# Patient Record
Sex: Female | Born: 1945 | Race: Black or African American | Hispanic: No | Marital: Single | Smoking: Never smoker
Health system: Southern US, Community
[De-identification: ages and names within clinical notes are randomized; demographics above are authoritative.]

## PROBLEM LIST (undated history)

## (undated) DIAGNOSIS — F419 Anxiety disorder, unspecified: Secondary | ICD-10-CM

## (undated) DIAGNOSIS — K5790 Diverticulosis of intestine, part unspecified, without perforation or abscess without bleeding: Secondary | ICD-10-CM

## (undated) DIAGNOSIS — K219 Gastro-esophageal reflux disease without esophagitis: Secondary | ICD-10-CM

## (undated) DIAGNOSIS — I10 Essential (primary) hypertension: Secondary | ICD-10-CM

## (undated) DIAGNOSIS — K2210 Ulcer of esophagus without bleeding: Secondary | ICD-10-CM

## (undated) HISTORY — PX: ABDOMINAL SURGERY: SHX537

## (undated) HISTORY — PX: OTHER SURGICAL HISTORY: SHX169

## (undated) HISTORY — DX: Gastro-esophageal reflux disease without esophagitis: K21.9

## (undated) HISTORY — DX: Ulcer of esophagus without bleeding: K22.10

## (undated) HISTORY — PX: ABDOMINAL HYSTERECTOMY: SHX81

## (undated) HISTORY — PX: APPENDECTOMY: SHX54

---

## 2003-12-07 ENCOUNTER — Emergency Department (HOSPITAL_COMMUNITY): Admission: EM | Admit: 2003-12-07 | Discharge: 2003-12-07 | Payer: Self-pay | Admitting: Emergency Medicine

## 2004-03-08 ENCOUNTER — Encounter: Payer: Self-pay | Admitting: Orthopaedic Surgery

## 2004-04-06 ENCOUNTER — Encounter: Payer: Self-pay | Admitting: Orthopaedic Surgery

## 2006-02-19 ENCOUNTER — Inpatient Hospital Stay (HOSPITAL_COMMUNITY): Admission: EM | Admit: 2006-02-19 | Discharge: 2006-02-23 | Payer: Self-pay | Admitting: Emergency Medicine

## 2006-02-19 ENCOUNTER — Ambulatory Visit: Payer: Self-pay | Admitting: Internal Medicine

## 2006-05-04 ENCOUNTER — Ambulatory Visit: Payer: Self-pay | Admitting: Internal Medicine

## 2006-05-16 ENCOUNTER — Ambulatory Visit (HOSPITAL_COMMUNITY): Admission: RE | Admit: 2006-05-16 | Discharge: 2006-05-16 | Payer: Self-pay | Admitting: Internal Medicine

## 2006-05-16 ENCOUNTER — Ambulatory Visit: Payer: Self-pay | Admitting: Internal Medicine

## 2006-07-06 ENCOUNTER — Ambulatory Visit (HOSPITAL_COMMUNITY): Admission: RE | Admit: 2006-07-06 | Discharge: 2006-07-06 | Payer: Self-pay | Admitting: Internal Medicine

## 2007-03-04 ENCOUNTER — Emergency Department (HOSPITAL_COMMUNITY): Admission: EM | Admit: 2007-03-04 | Discharge: 2007-03-04 | Payer: Self-pay | Admitting: Emergency Medicine

## 2007-03-26 ENCOUNTER — Emergency Department (HOSPITAL_COMMUNITY): Admission: EM | Admit: 2007-03-26 | Discharge: 2007-03-26 | Payer: Self-pay | Admitting: Emergency Medicine

## 2007-04-05 ENCOUNTER — Emergency Department (HOSPITAL_COMMUNITY): Admission: EM | Admit: 2007-04-05 | Discharge: 2007-04-05 | Payer: Self-pay | Admitting: Emergency Medicine

## 2007-10-18 ENCOUNTER — Emergency Department (HOSPITAL_COMMUNITY): Admission: EM | Admit: 2007-10-18 | Discharge: 2007-10-18 | Payer: Self-pay | Admitting: Emergency Medicine

## 2007-10-31 ENCOUNTER — Emergency Department (HOSPITAL_COMMUNITY): Admission: EM | Admit: 2007-10-31 | Discharge: 2007-10-31 | Payer: Self-pay | Admitting: Emergency Medicine

## 2009-01-25 ENCOUNTER — Emergency Department (HOSPITAL_COMMUNITY): Admission: EM | Admit: 2009-01-25 | Discharge: 2009-01-25 | Payer: Self-pay | Admitting: Emergency Medicine

## 2009-05-30 IMAGING — CT CT HEAD W/O CM
1 series · 16 of 30 positions shown, 20 images · IV contrast (agent unspecified)
Comparison: none

CLINICAL DATA: Dizziness.  No known injury.  
 HEAD CT WITHOUT CONTRAST:
TECHNIQUE: Contiguous axial images were obtained from the base of the skull through the vertex according to standard protocol without contrast.

[Series 2: headseq 4.8 h37s · axial · 0.45mm/px · z∈[+115,+248]mm · 16 of 30 slices shown, 20 images]
[im 2/30  brain]
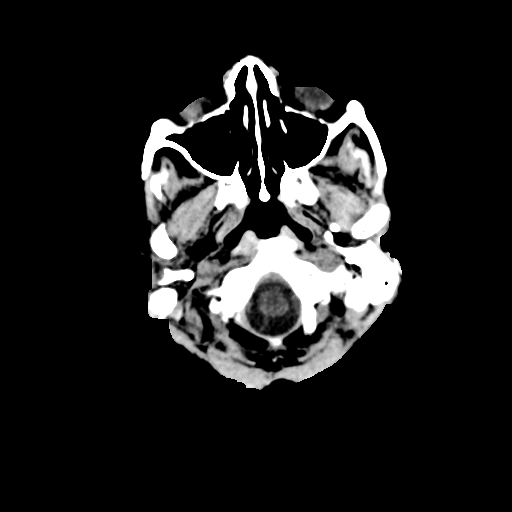
[im 2/30  bone]
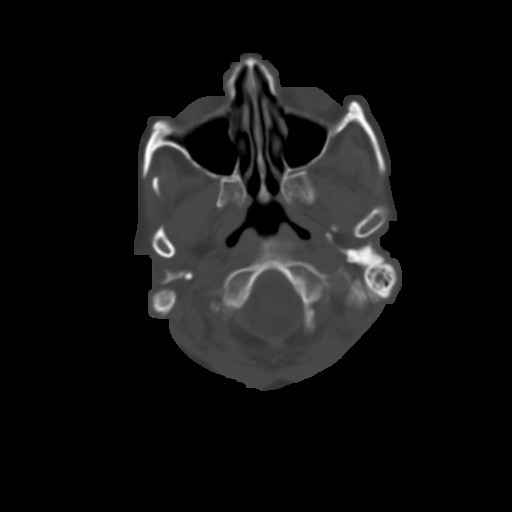
[im 4/30  brain]
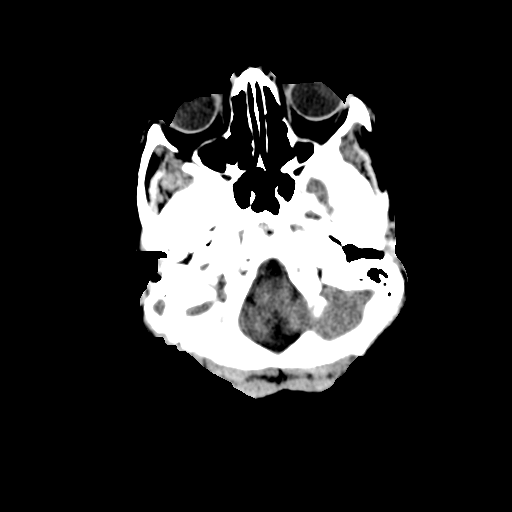
[im 6/30  brain]
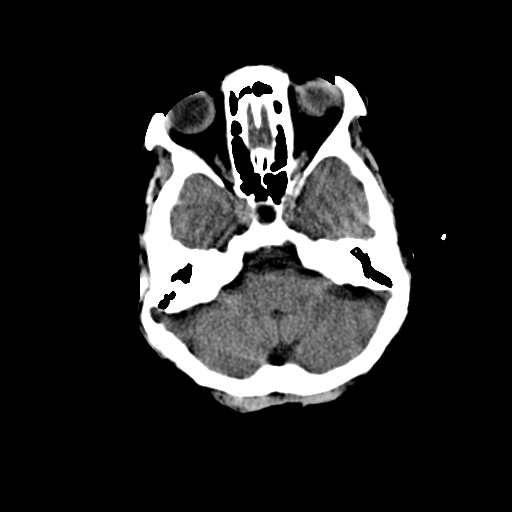
[im 8/30  brain]
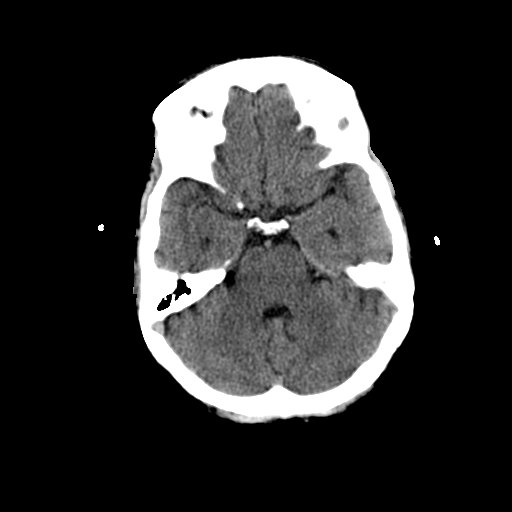
[im 9/30  brain]
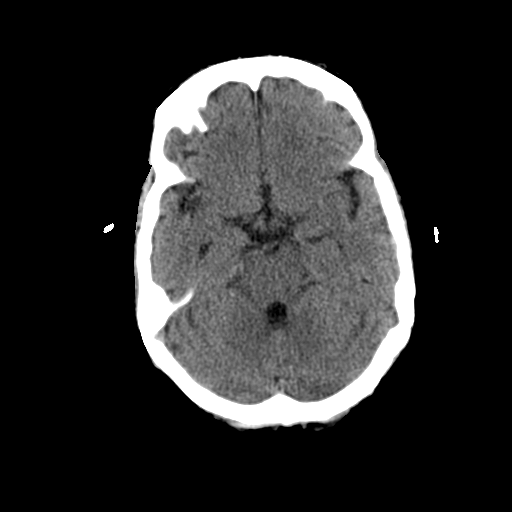
[im 9/30  bone]
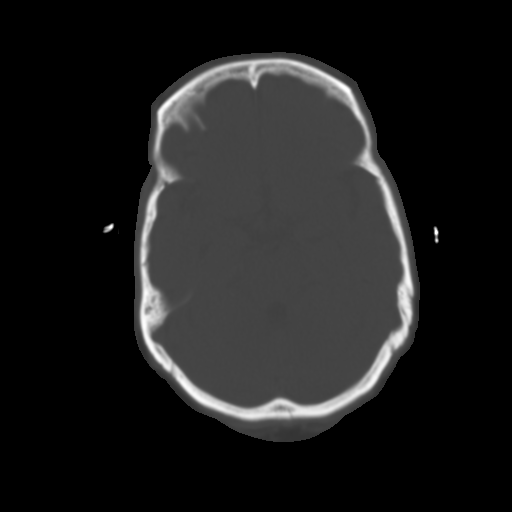
[im 11/30  brain]
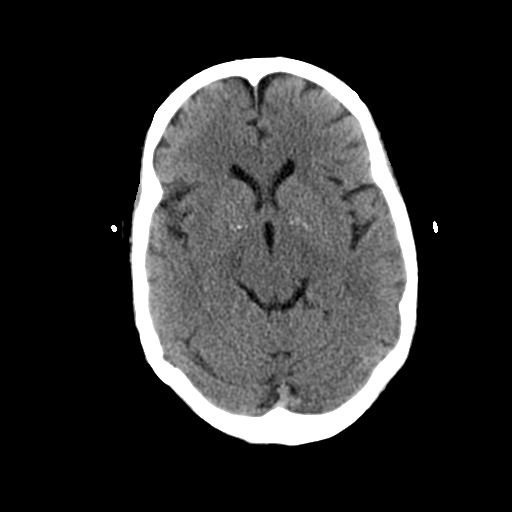
[im 13/30  brain]
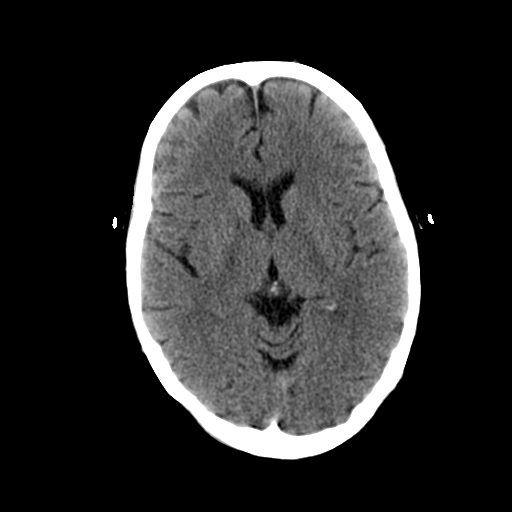
[im 15/30  brain]
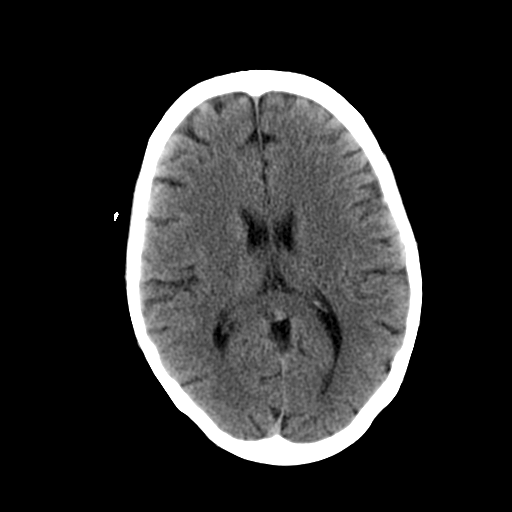
[im 16/30  brain]
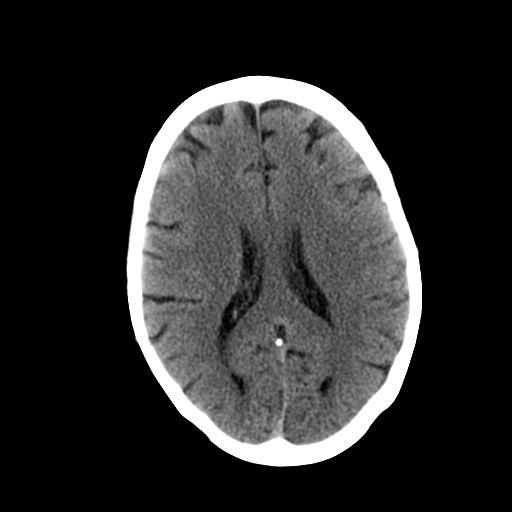
[im 16/30  bone]
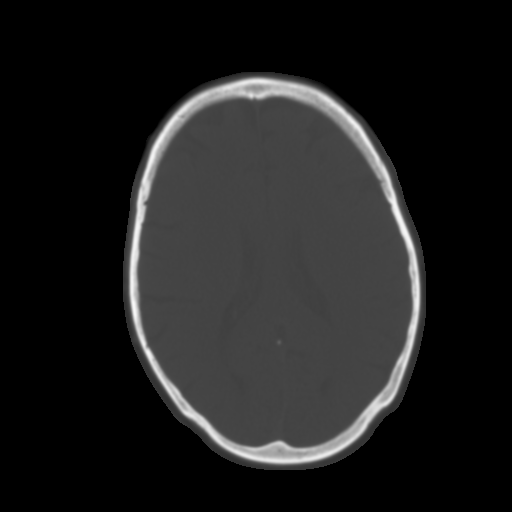
[im 18/30  brain]
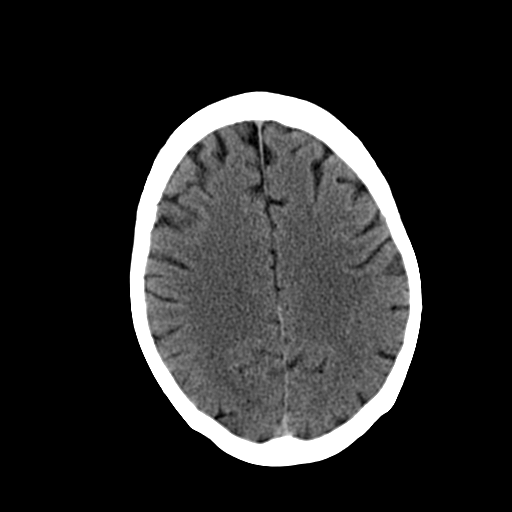
[im 20/30  brain]
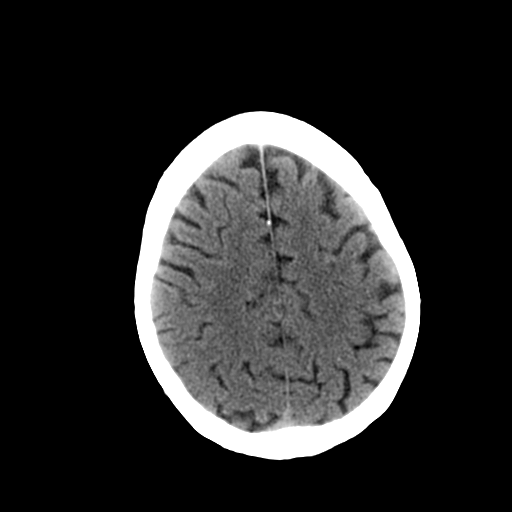
[im 22/30  brain]
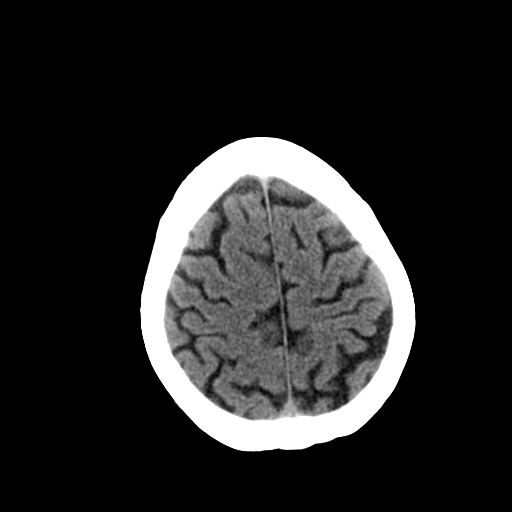
[im 23/30  brain]
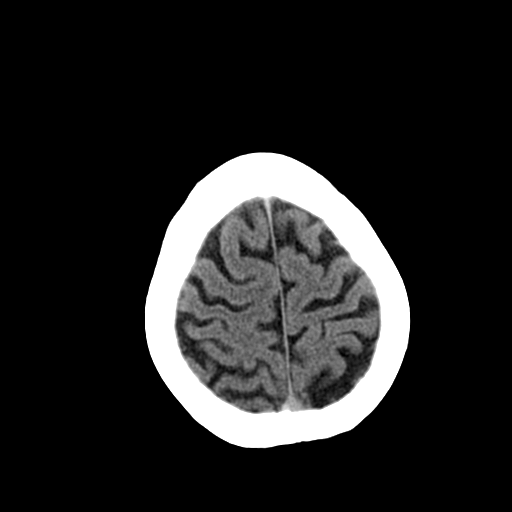
[im 23/30  bone]
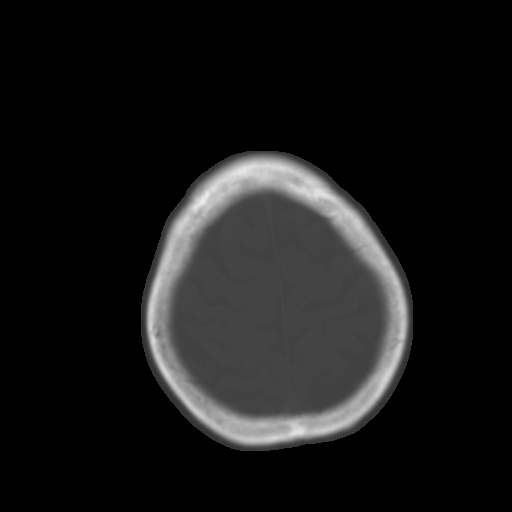
[im 25/30  brain]
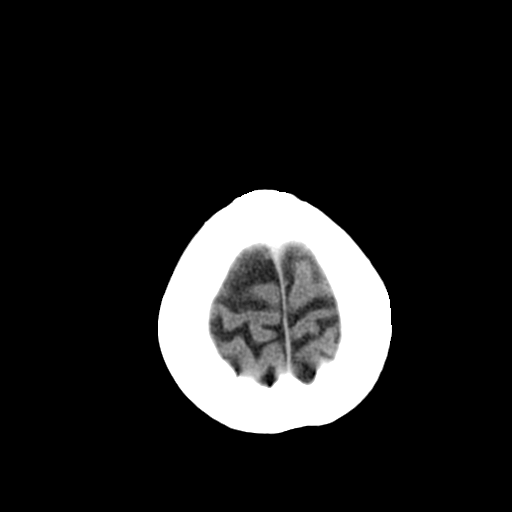
[im 27/30  brain]
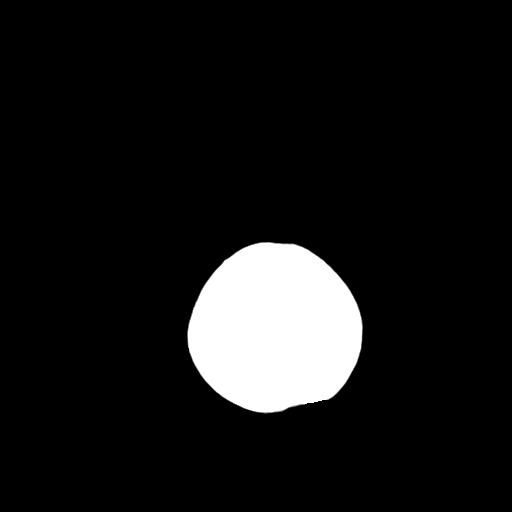
[im 29/30  brain]
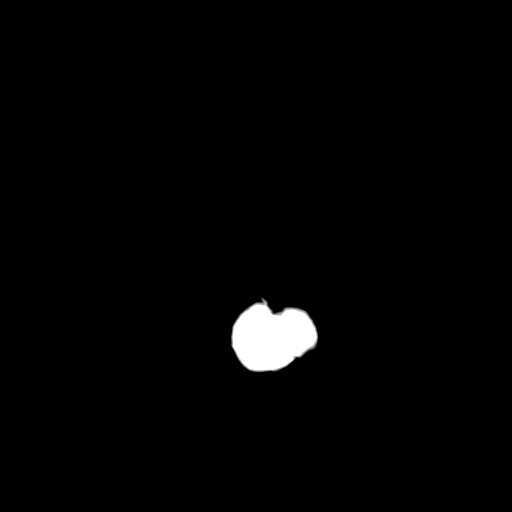

[16 of 30 positions shown; findings below may reference images not displayed]

FINDINGS: Mild premature atrophy is present.  There is no significant white matter disease.  I see no acute stroke, intracranial hemorrhage, mass effect, hydrocephalus, or extra-axial fluid.  Calvarium is intact.  Paranasal sinuses are clear.  There is some fluid in a large right mastoid air cell, which appears coalescent.  This could represent chronic mastoiditis.  Correlate clinically.  No air fluid levels are seen.
IMPRESSION: 1.  No acute intracranial findings.  
 2.  Possible chronic mastoiditis on the right.

## 2010-02-03 ENCOUNTER — Emergency Department (HOSPITAL_COMMUNITY): Admission: EM | Admit: 2010-02-03 | Discharge: 2010-02-03 | Payer: Self-pay | Admitting: Emergency Medicine

## 2010-08-20 LAB — COMPREHENSIVE METABOLIC PANEL
BUN: 13 mg/dL (ref 6–23)
CO2: 29 mEq/L (ref 19–32)
Calcium: 9.5 mg/dL (ref 8.4–10.5)
Chloride: 104 mEq/L (ref 96–112)
GFR calc Af Amer: >60 mL/min (ref >60)
Glucose, Bld: 92 mg/dL (ref 70–99)
Potassium: 3.6 mEq/L (ref 3.5–5.1)

## 2010-08-20 LAB — DIFFERENTIAL
Basophils Absolute: 0 10*3/uL (ref 0.0–0.1)
Eosinophils Relative: 1 % (ref 0–5)
Lymphocytes Relative: 28 % (ref 12–46)
Neutrophils Relative %: 60 % (ref 43–77)

## 2010-08-20 LAB — CBC
MCH: 29.3 pg (ref 26.0–34.0)
MCHC: 32.7 g/dL (ref 30.0–36.0)

## 2010-09-11 LAB — DIFFERENTIAL
Eosinophils Relative: 1 % (ref 0–5)
Lymphocytes Relative: 21 % (ref 12–46)
Neutro Abs: 4.2 10*3/uL (ref 1.7–7.7)
Neutrophils Relative %: 65 % (ref 43–77)

## 2010-09-11 LAB — COMPREHENSIVE METABOLIC PANEL
Albumin: 3.7 g/dL (ref 3.5–5.2)
Alkaline Phosphatase: 93 U/L (ref 39–117)
GFR calc Af Amer: >60 mL/min (ref >60)
GFR calc non Af Amer: 54 mL/min — ABNORMAL LOW (ref >60)
Total Bilirubin: 0.3 mg/dL (ref 0.3–1.2)
Total Protein: 8.2 g/dL (ref 6.0–8.3)

## 2010-09-11 LAB — URINALYSIS, ROUTINE W REFLEX MICROSCOPIC
Hgb urine dipstick: NEGATIVE
Ketones, ur: NEGATIVE mg/dL
Nitrite: NEGATIVE
Specific Gravity, Urine: 1.01 (ref 1.005–1.030)

## 2010-09-11 LAB — CBC
HCT: 35.4 % — ABNORMAL LOW (ref 36.0–46.0)
Hemoglobin: 12 g/dL (ref 12.0–15.0)
MCHC: 34 g/dL (ref 30.0–36.0)
RBC: 4.04 MIL/uL (ref 3.87–5.11)
RDW: 13.3 % (ref 11.5–15.5)

## 2010-09-11 LAB — URINE CULTURE: Culture: NO GROWTH

## 2010-10-21 ENCOUNTER — Emergency Department (HOSPITAL_COMMUNITY)
Admission: EM | Admit: 2010-10-21 | Discharge: 2010-10-21 | Disposition: A | Discharge status: Home / Self Care | Payer: Medicare Other | Attending: Emergency Medicine | Admitting: Emergency Medicine

## 2010-10-21 DIAGNOSIS — N949 Unspecified condition associated with female genital organs and menstrual cycle: Secondary | ICD-10-CM | POA: Insufficient documentation

## 2010-10-21 DIAGNOSIS — N938 Other specified abnormal uterine and vaginal bleeding: Secondary | ICD-10-CM | POA: Insufficient documentation

## 2010-10-21 DIAGNOSIS — F411 Generalized anxiety disorder: Secondary | ICD-10-CM | POA: Insufficient documentation

## 2010-10-21 DIAGNOSIS — I10 Essential (primary) hypertension: Secondary | ICD-10-CM | POA: Insufficient documentation

## 2010-10-21 DIAGNOSIS — N925 Other specified irregular menstruation: Secondary | ICD-10-CM | POA: Insufficient documentation

## 2010-10-21 DIAGNOSIS — Z79899 Other long term (current) drug therapy: Secondary | ICD-10-CM | POA: Insufficient documentation

## 2010-10-22 NOTE — Consult Note (Signed)
Candace [name]                ACCOUNT NO.:  [id number]   MEDICAL RECORD NO.:  [id number]          PATIENT TYPE:  INP   LOCATION:  IC04                          FACILITY:  [facility]   PHYSICIAN:  Lionel December, M.D.    DATE OF BIRTH:  Jun 15, 1945   DATE OF CONSULTATION:  02/19/2006  DATE OF DISCHARGE:                                   CONSULTATION   REASON FOR CONSULTATION:  Rectal bleeding and anemia.   HISTORY OF PRESENT ILLNESS:  Candace [name] is a 65 year old African American female  who presented to emergency room this morning with 78-hour history of rectal  bleeding.  The patient states she had she woke up around 12:30 in the  morning with abdominal cramps and felt like she needed to have a bowel  movement.  She passed some stool followed by blood and she went maybe four  more times and passed bright side blood and some clots.  She felt dizzy.  She did not experience nausea or vomiting.  She also did not experience  chest pain or dyspnea.  In the emergency room, she was seen by Dr. Greta Doom.  She was noted to be anemic with hemoglobin of 9 g.  She was  therefore admitted to Dr. Sherle Poe.  The patient has been begun on IV  fluids and she has been typed and crossmatched.  She is also getting  Protonix IV.   There is no prior history of GI bleed.  She has had three colonoscopies at  [facility].  The last one was about 2 years ago and she is aware that  she has diverticulosis, although she has never had diverticulitis or  bleeding.  She has history of gastric neoplasm, details unknown, for which  she had surgery also at [facility] in 1995.  She believes she had her  upper GI tract looked at maybe 7 years ago.  She has occasional heartburn  with certain foods.  She denies dysphagia, anorexia or weight loss.   HOME MEDICATIONS:  1. KCl 20 mEq b.i.d.  2. Hydrochlorothiazide 12.5 mg daily.  3. Colace 100 mg daily p.r.n. which she takes occasionally and Zyprexa 10      mg  nightly.  4. She is also on B12 injection once a month which was begun in 1995      following her gastric surgery.  5. She also takes Goody powders p.r.n.  The last time she took it was over      a week ago.  6. She is presently on Protonix 40 mg IV q.12h.  7. Tylenol 600 mg q.4h. p.r.n.  8. Ambien 5-10 mg nightly p.r.n.  9. Zyprexa 10 mg few nightly.   PAST MEDICAL HISTORY:  1. She has been hypertensive for 20 years.  2. She has been diabetic for a couple years but has been diet-controlled.  3. She has chronic insomnia.  4. Gastric surgery for a tumor in 1995 as above.  5. History of diverticulosis picked up on what appears to be high risk or      screening  colonoscopy.   PAST SURGICAL HISTORY:  1. Cholecystectomy in 1974.  2. She had hysterectomy in the 1980s.  3. Cholecystectomy in 1974.  4. Hysterectomy in 1980.   ALLERGIES:  PENICILLIN causing itching and swelling.   FAMILY HISTORY:  Mother at 65 has osteoarthrosis in the arm and doing fairly  well.  Father died of prostate carcinoma at age 58.  She has two brothers  and three sisters who are in good health.   SOCIAL HISTORY:  She is single.  She has never married.  She has one son.  She works with [employer].  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  She smoked  for several years but quit in May 2007 and she does not drink alcohol.   PHYSICAL EXAMINATION:  GENERAL APPEARANCE:  A pleasant, well-developed, thin  African American female who is in no acute distress.  VITAL SIGNS:  She weighs 55.2 kg.  She is 5 feet 4 inches tall.  Pulse 60  per minute, blood pressure 125/60, respirations 18 and temperature is 98.4.  HEENT:  Conjunctivae is somewhat pale.  Sclera is nonicteric.  Oropharynx  mucosa is normal.  She has few teeth in her lower jaw and some in upper.  NECK:  No neck masses or thyromegaly noted.  CARDIAC:  Regular rhythm.  Normal S1-S2.  No murmur or gallop noted.  LUNGS:  Clear to auscultation.  ABDOMEN:   Symmetrical.  She has a long scar across her upper abdomen.  Bowel  sounds are normal.  No bruits noted.  Palpation reveals soft abdomen with  mild tenderness at LLQ, no organomegaly or masses.  RECTAL:  Examination deferred as she had one in the emergency room by Dr.  Colon Branch, revealing bright red blood per rectum.  EXTREMITIES:  No clubbing or edema noted.   LABS ON ADMISSION:  WBC 8.5, H&H is 9 and 27.3, platelet count is 436 K.  INR is 1.1, PTT is 30.  Sodium 138, potassium 3.5, chloride 103, CO2 is 27,  glucose 133, BUN 17, creatinine 0.8, bilirubin is 4.2, AP 87, AST 16, ALT  11, total protein 7.2 with albumin of 3, calcium is 8.5.   ASSESSMENT:  Candace [name] is a 65 year old African American female who presents  with acute gastrointestinal bleed, possibly from colonic source.  The  differential diagnosis includes diverticular bleed but she could also have  ischemic colitis.  There is also a remote possibility that this could be  from upper gastrointestinal source with rapid transit.  She appears to be  hemodynamically stable.  She has not had a bowel movement in 4 hours which  is reassuring, implying either she stopped bleeding or slowed significantly.   Agree with empiric therapy with PPI in this setting and serial hemoglobins  and hematocrits.  Suspect significant drop in her hemoglobin and hematocrit  post hydration and she possibly will need a transfusion.   RECOMMENDATIONS:  Will request a GI records from [facility].   She will be prepped for colonoscopy early in a.m. and procedure will be  performed in the afternoon.  If colonoscopy is negative other than  diverticulosis, would also examined her upper GI tract.   I have reviewed the procedures and potential risks with the patient and she  is agreeable.  I also talked with his son and her brother.   We would like to thank Dr. Sherle Poe for the opportunity to participate in  the care of this nice lady.  Lionel December, M.D.  Electronically Signed     NR/MEDQ  D:  02/19/2006  T:  02/20/2006  Job:  [id number]

## 2010-10-22 NOTE — Op Note (Signed)
NAMESHARINA, Candace [name]                ACCOUNT NO.:  [id number]   MEDICAL RECORD NO.:  [id number]          PATIENT TYPE:  INP   LOCATION:  IC04                          FACILITY:  [facility]   PHYSICIAN:  Lionel December, M.D.    DATE OF BIRTH:  [birth year]   DATE OF PROCEDURE:  02/20/2006  DATE OF DISCHARGE:                                 OPERATIVE REPORT   PROCEDURE:  Colonoscopy followed by esophagogastroduodenoscopy with  therapeutic intervention.   INDICATION:  Candace [name] is a 65 year old African American female who  presents with acute GI bleed and anemia.  Her hemoglobin dropped from 9 on  admission to 7.1 and she has received 2 units of PRBCs.  She is undergoing  diagnostic colonoscopy, if negative, to be followed by EGD.  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  The procedure and risks were reviewed with the patient and informed  consent was obtained.   MEDS FOR CONSCIOUS SEDATION:  Demerol 50 mg IV, Versed 8 mg IV in divided  doses, benzocaine spray for pharyngeal topical anesthesia.   FINDINGS:  The procedures were performed in the endoscopy suite.  The  patient's vital signs and O2 sat were monitored during the procedure and  remained stable.   COLONOSCOPY:  Rectal examination was performed.  No abnormalities were noted  on external or digital exam.  The Olympus videoscope was placed int he  rectum and advanced under vision into sigmoid colon which was very tortuous  with numerous diverticula, some of which were quite large.  There were a few  pieces of formed stool.  Slowly and carefully, the scope was advanced  proximally under direct vision and finally into cecum.  There were  diverticula involving the rest of the colon but not as many as in sigmoid  colon.  There was a large clot in the cecum across from the ileocecal valve.  Blunt end cecum was normal.  This area was washed vigorously, however, this  clot could not be  completely dislodged.  It was felt that it was either  overlying a diverticulum or a clot coming from the proximal GI tract.  As  the scope was withdrawn, the colonic mucosa was examined for the second time  and there was no active bleeding noted from any of the diverticula.  Similarly, no AV malformations or polyps were noted.  The rectal mucosa was  normal.  The scope was retroflexed to examine the anorectal junction which  was unremarkable.  As the scope was withdrawn, preparation was made for  esophagogastroduodenoscopy.   ESOPHAGOGASTRODUODENOSCOPY:  The Olympus videoscope was passed via  oropharynx without any difficulty into esophagus.   Esophagus:  The mucosa of the esophagus was normal.  The GE junction was at  31 cm and hiatus was at 33.  Noncritical ring was noted at the GE junction.   Stomach: A large gastric pouch with a moderate amount of bile in it.  Gastroduodenostomy was patent.  There was a small ulcer along the lesser  curvature with active  bleeding/oozing.  This area was washed and was  observed for a few minutes and bleeding continued.  The scope was  retroflexed to examine the fundus and cardia which was normal.  The mucosa  of the stomach proximal to the anastomosis revealed erythema and edema.  Gastrojejunostomy was patent.  This bleeding ulcer was coagulated using Gold  probe with excellent hemostasis.   The small bowel mucosa of duodenum as well as proximal jejunum was normal.  The folds were similarly normal.  The endoscope was withdrawn.  The patient  tolerated the procedure well.   FINAL DIAGNOSIS:  1.  Pancolonic diverticulosis with extensive diverticula at sigmoid colon      which is very tortuous.  2.  Large clot at cecum possibly from upper source.  3.  Gastric ulcer with stigmata of active bleeding proximal to      gastroduodenostomy which was sealed/coagulated with the use of Gold      probe.  4.  Small sliding hiatal hernia.    RECOMMENDATIONS:  1.  Full liquid diet.  2.  Will continue IV PPI for another 24 hours and then she will be switched      to p.o.  3.  Helicobacter pylori serology.  4.  She will continue to have H&H later today and in a.m.   The patient will need follow-up EGD in 8-10 weeks which we will arrange.      Lionel December, M.D.  Electronically Signed     NR/MEDQ  D:  02/20/2006  T:  02/21/2006  Job:  [id number]   cc:   [name]. Josefine Class, M.D.  Fax: [phone]

## 2010-10-22 NOTE — H&P (Signed)
NAMEHEATHER, Candace [name]                ACCOUNT NO.:  [id number]   MEDICAL RECORD NO.:  [id number]          PATIENT TYPE:  AMB   LOCATION:  DAY                           FACILITY:  [facility]   PHYSICIAN:  Lionel December, M.D.    DATE OF BIRTH:  Aug 06, 1945   DATE OF ADMISSION:  DATE OF DISCHARGE:  LH                              HISTORY & PHYSICAL   PRESENTING COMPLAINT:  Follow for upper GI bleed secondary to gastric  ulcer.   HISTORY OF PRESENT ILLNESS:  Candace [name] is a 65 year old African American  female who was admitted to [facility] on 02/19/06 with rectal bleeding  and anemia.  She was felt to have lower GI bleed.  She will receive 4  units of PRBC's.  She had a colonoscopy on 02/20/06 which revealed pan  colonic diverticulosis without active bleeding.  She had a large clot in  the cecum.  Therefore, upper GI source of suspect is hiatal bleeding and  she had EGD.  She had gastric ulcer with stigmata of active bleeding  proximal to gastroduodenostomy which was treated with gold probe and  showed small sliding hiatal hernia.  Her H pylori serology was positive  and she was given 10 days of pylera.  She states she was able to  complete therapy without any problems.   She feels fine.  She has good appetite.  Her bowels usually move daily.  Every now and then she gets constipated and tries to control it with  diet and Colace.  She denies melena or rectal bleeding, abdominal pain,  nausea, vomiting.  She states her heartburn has resolved since she  completed pylera.   She is on HCTZ 25 mg q.d.  Zyprexa 10 mg q. h.s.  MVI daily.   PAST MEDICAL HISTORY:  Hypertension of 20 years duration. Diet  controlled diabetes mellitus. Chronic insomnia.  She is status post  partial gastrectomy for gastric neoplasm in 1995.  Details are not  available.  She also had cholecystectomy in 1974. Hysterectomy in 1980.   ALLERGIES:  To penicillin causing itching.   SOCIAL HISTORY:  She is single.  She has one  son.  She works with [employer] system.  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  She had smoked cigarettes for  years but quit in May 2007.  She does not drink alcohol.   FAMILY HISTORY:  Is negative for colorectal carcinoma.   OBJECTIVE:  Weight 127 pounds.  She is 5 feet 4 inches tall.  Pulse 68  per minute, blood pressure 138/80, temperature is 98.5.  Conjunctivae is  pink.  Sclerae is nonicteric.  Oral pharyngeal mucosa is normal.  She  has 4 teeth in lower jaw which she is in the process of having removed  so she can get dentures.  No neck masses or thyromegaly noted.  Cardiac  exam with regular rhythm.  Normal S1-S3.  No murmur or gallop noted.  Lungs:  Clear to auscultation.  Abdomen is flat and soft without  tenderness, organomegaly or masses.  No peripheral edema or clubbing  noted.   ASSESSMENT:  Candace [name] is a 65 year old Afro American female who was treated  for 4 unit upper GI bleed secondary to gastric ulcer proximal to  gastroduodenostomy who has also been treated for H pylori, gastritis  with pylera and is doing well.  She needs to undergo repeat EGD to  document complete healing of this ulcer.   PLAN:  EGD in the future.  She will have H&H and serum potassium checked  at that time.  She is very familiar with procedure and risks and is  agreeable __________ .      Lionel December, M.D.  Electronically Signed     NR/MEDQ  D:  05/04/2006  T:  05/05/2006  Job:  [id number]

## 2010-10-22 NOTE — H&P (Signed)
NAMEHEAVEN, MEEKER                ACCOUNT NO.:  [id number]   MEDICAL RECORD NO.:  [id number]          PATIENT TYPE:  INP   LOCATION:  IC04                          FACILITY:  [facility]   PHYSICIAN:  Margaretmary Dys, M.D.DATE OF BIRTH:  Mar 17, 1946   DATE OF ADMISSION:  02/19/2006  DATE OF DISCHARGE:  LH                                HISTORY & PHYSICAL   ADMITTING DIAGNOSES:  1. Gastrointestinal bleed.  2. Severe anemia, likely secondary to acute blood loss.  3. Orthostatic hypotension.  4. History of diverticulosis.   CHIEF COMPLAINT:  GI bleed around 1:00 a.m. today.   HISTORY OF PRESENT ILLNESS:  Ms. Dinsmore is a 65 year old African-American  female who was brought to the emergency room by her brother. The patient  said she woke up about 1:00 a.m. to go to the bathroom and when she sat down  she saw mostly blood with no stool. It was bright red in color. She denied  any cramping.   She states she had about 1 to 2 ounces of blood loss at the time.  She went  back to bed without any symptoms of dizziness or lightheadedness.  However,  she had four more episodes and probably had close to 20 ounces of blood  loss. She subsequently decided to come to the emergency room and presented  around 8:00. The patient says she has also developed some dizziness and felt  lightheaded. She denied any fall. She has had no shortness of breath. No  chest pain. No headaches.  She had no nausea or vomiting. No hematemesis.  The patient says she has never had bleeding per rectum. She does follow with  Dr. Chauncey Mann at [facility] and had a colonoscopy in 2005 which according to  the patient was unremarkable except for diverticulosis.   The patient denies any fevers or chills.   Evaluation in the emergency room confirmed the presence of bleeding per  rectum and she was also significantly anemic with a hemoglobin of 9. The  patient is now being admitted for further evaluation and management.   REVIEW OF  SYSTEMS:  The patient denies any weight loss. No hematuria. No  rectal pain. No abdominal pain or crampiness. Otherwise negative. No night  sweats.   PAST MEDICAL HISTORY:  1. Hypertension.  2. Diverticulitis.   PAST SURGICAL HISTORY:  1. Status post cholecystectomy.  2. Status post hysterectomy.  3. Status post removal of a gastric tumor which the patient said was      benign.   MEDICATIONS:  She is on Zyprexa 10 mg p.o. at bedtime, potassium chloride 10  mEq p.o. b.i.d., docusate sodium 100 mg as needed, hydrochlorothiazide 25 mg  p.o. once a day.   FAMILY HISTORY:  Family history is positive. Her father died of prostate  cancer. Otherwise was unremarkable.   SOCIAL HISTORY:  The patient is single. Is independent in activities of  daily living. She lives at home with a brother. She remains fairly active  and continues to work at [employer].   She does not drink alcohol. Denies any illicit  drug use. Quit smoking in May  2007 after a 40 pack-year history of smoking.   ALLERGIES:  The patient is allergic to PENICILLIN.   PHYSICAL EXAMINATION:  GENERAL:  She was conscious, alert and comfortable,  not in acute distress. Was well oriented to time, place and person.  VITAL SIGNS:  Initially on arrival with the patient sitting down her blood  pressure was 108/72 with a temperature of 97.3, pulse of 99, respirations of  20. However, when the patient was standing blood pressure dropped to 96/57.  Heart rate went up to 212. Respiratory rate was 24.  HEENT EXAM:  Normocephalic, atraumatic. Oral mucosa was moist with no  exudates.  NECK:  Supple. No JVD or lymphadenopathy.  LUNGS:  Clear anteriorly with good air entry bilaterally.  HEART: S1 and S2 regular. No S3. No gallops or rubs.  ABDOMEN:  Soft, nontender. Bowel sounds are positive. No masses palpable.  EXTREMITIES:  No pedal edema. No calf induration or tenderness was noted.  CNS EXAM:  Grossly intact with no focal  neurologic deficit deficits.  RECTAL:  Occult blood per ED was positive.   LABORATORY/DIAGNOSTIC DATA:  White blood cells was 8.5. Hemoglobin of 9,  hematocrit of 27. Platelet count was 436.  I do not have a recent H and H on  her for comparison.  PT was 13.9. INR was 1.1, PTT of 30. Sodium was 138,  potassium 3.5, chloride of 103. CO2 was 27, glucose 133, BUN of 17.  Creatinine was 0.7. Bilirubin was 0.2. AST was 16, ALT of 11. Albumin was 3,  calcium of 8.5.   ASSESSMENT AND PLAN:  Ms. Frysinger is a 65 year old African-American female  who presented to the emergency room with bleeding per rectum. I suspect this  is likely bleeding from a diverticula.   Plan is to admit her to the Intensive Care Unit at this time as she does  have evidence of orthostatic hypotension. Will check her hemoglobin and  hematocrit in about six hours and depending on what the number is, we may  transfuse her. I will give IV fluids. The patient will have two 18-gauge  wire-bore needles.   Will hold her blood pressure medications. Put her on Protonix 40 mg IV  q.12h. The patient will need a colonoscopy when more stable. If she begins  to bleed actively again will be consulting Dr. Karilyn Cota. I will type and cross  match four units of packed red blood cells in anticipation of future bleed  and transfusion. I do not see any clear indication to transfuse at this  time.   I have explained the above plan to the patient. She verbalized full  understanding.   CODE STATUS:  The patient is a full code.      Margaretmary Dys, M.D.  Electronically Signed     AM/MEDQ  D:  02/19/2006  T:  02/19/2006  Job:  [id number]

## 2010-10-22 NOTE — Op Note (Signed)
[name], [name]                ACCOUNT NO.:  [id number]   MEDICAL RECORD NO.:  [id number]          PATIENT TYPE:  AMB   LOCATION:  DAY                           FACILITY:  [facility]   PHYSICIAN:  Lionel December, M.D.    DATE OF BIRTH:  [birth year]   DATE OF PROCEDURE:  05/16/2006  DATE OF DISCHARGE:                               OPERATIVE REPORT   PROCEDURE:  Esophagogastroduodenoscopy.   INDICATIONS:  [name] is a 65 year old Afro-American female who was  hospitalized in September with a GI bleed.  She was found to be bleeding  from a gastric ulcer.  She had an endoscopic therapy for hemostasis.  She also has been treated for H. pylori gastritis with Pylora.  She is  now returning for follow-up exam to make sure the ulcer has healed  completely.  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  Procedure and risks were reviewed with the  patient, informed consent was obtained.   MEDICATIONS FOR CONSCIOUS SEDATION:  Benzocaine spray for pharyngeal  topical anesthesia, Demerol 25 mg IV, Versed 4 mg IV.   FINDINGS:  Procedure performed in endoscopy suite.  The patient's vital  signs and O2 saturation were monitored during the procedure and remained  stable.  The patient was placed in the left lateral recumbent position  and the Olympus video scope was passed via oropharynx without any  difficulty into esophagus.   Esophagus:  Mucosa of the proximal and middle third was normal.  The  distal 5 cm had multiple erosions.  The GE junction was at 32 cm and the  hiatus was at 34.   Stomach:  Large gastric pouch with linear erythema, a single prepyloric  erosion noted proximal to gastroduodenostomy.  The scope was easily  retroflexed to examine the fundus and cardia, which was unremarkable.  The site where the ulcer was noted on previous exam along the lesser  curvature proximal to anastomosis had completely healed.   Small bowel:  The scope was passed into small  bowel for about 15 cm and  mucosa and folds were normal.  Endoscope was withdrawn.  The patient  tolerated the procedure well.   FINAL DIAGNOSES:  1. 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  2. Gastric ulcer has completely healed.  3. Gastritis in her erosion.  4. Patent gastroduodenostomy.   RECOMMENDATIONS:  1. Antireflux measures.  2. Omeprazole 20 mg p.o. q.a.m., prescription given for 30 with 11      refills.   Please note:  Her H&H today is 12 and 36, and her serum potassium was  3.5.      Lionel December, M.D.  Electronically Signed     NR/MEDQ  D:  05/16/2006  T:  05/16/2006  Job:  [id number]

## 2010-10-22 NOTE — Discharge Summary (Signed)
Candace [name], Candace [name]                ACCOUNT NO.:  [id number]   MEDICAL RECORD NO.:  [id number]          PATIENT TYPE:  INP   LOCATION:  A216                          FACILITY:  [facility]   PHYSICIAN:  Osvaldo Shipper, MD     DATE OF BIRTH:  02-20-1946   DATE OF ADMISSION:  02/19/2006  DATE OF DISCHARGE:  09/20/2007LH                                 DISCHARGE SUMMARY   PRIMARY CARE PHYSICIAN:  Unknown at this time.   DISCHARGE DIAGNOSES:  1. Gastrointestinal bleed from gastric ulcer with active bleeding on EGD,      improved.  2. Blood loss anemia.  3. History of hypertension.  4. History of diverticulosis.   BRIEF HOSPITAL COURSE:  The patient is a 65 year old African-American female  who was brought into the ED by her brother.  She was found to have frank red  blood when she went to the toilet to have a bowel movement.  She did not  have any symptoms otherwise with this.  The patient presented to the ED and  was found to have a hemoglobin of 9.0.  It was initially felt that the  bleeding possibly was secondary to diverticula or hemorrhoidal.  The patient  was admitted to the hospital and underwent and monitoring of her H&H.  Her  hemoglobin dropped to 7.1 which prompted transfusions.  She required in all  about 4 units of packed red blood cells.  The patient was also seen by  gastroenterology service who performed colonoscopy and an upper GI endoscopy  that showed pan colonic diverticulosis with extensive diverticula in the  sigmoid colon.  A large clot was noted at the cecum, possibly from upper  source.  EGD subsequently done showed a gastric ulcer with stigmata of  active bleeding proximal to the gastroduodenostomy.  This was consistent  with a history of gastric surgery for tumor in 1995.  The gastric ulcer was  sealed with gold probe.  The patient was slowly started back on diet.  She  continued to have good p.o. intake.  Her H&H remained stable over the  following 2-3 days.  H.  pylori was sent, came back positive and GI told us  that they would be making recommendations for this as an outpatient.  Did  not have any other abnormal or remarkable blood work.  Her other medical  issues remain stable.   DISCHARGE MEDICATIONS:  Protonix 40 mg p.o. b.i.d. was recommended to the  patient.  The patient was to also continue her other outpatient medications  as before (Please see H&P).  She was also not to continue any aspirin or any  other NSAIDs.   FOLLOWUP:  With GI service in 8 weeks for repeat upper endoscopy.  GI was  also to recommend treatment for H. pylori.   DIET:  Patient to have low-salt diet.   PHYSICAL ACTIVITY:  No restrictions placed.   PROCEDURES DONE:  EGD and colonoscopy.   CONSULTATIONS:  Dr. Lionel December from Gastroenterology.      Osvaldo Shipper, MD  Electronically Signed     GK/MEDQ  D:  02/27/2006  T:  02/27/2006  Job:  [id number]   cc:   Lionel December, M.D.  [address]  [place]  [place] [address]

## 2011-02-21 ENCOUNTER — Emergency Department (HOSPITAL_COMMUNITY)
Admission: EM | Admit: 2011-02-21 | Discharge: 2011-02-22 | Disposition: A | Discharge status: Home / Self Care | Payer: Medicare Other | Attending: Emergency Medicine | Admitting: Emergency Medicine

## 2011-02-21 ENCOUNTER — Encounter: Payer: Self-pay | Admitting: *Deleted

## 2011-02-21 DIAGNOSIS — R1013 Epigastric pain: Secondary | ICD-10-CM

## 2011-02-21 DIAGNOSIS — K5900 Constipation, unspecified: Secondary | ICD-10-CM

## 2011-02-21 DIAGNOSIS — I10 Essential (primary) hypertension: Secondary | ICD-10-CM | POA: Insufficient documentation

## 2011-02-21 DIAGNOSIS — R10816 Epigastric abdominal tenderness: Secondary | ICD-10-CM | POA: Insufficient documentation

## 2011-02-21 DIAGNOSIS — W57XXXA Bitten or stung by nonvenomous insect and other nonvenomous arthropods, initial encounter: Secondary | ICD-10-CM

## 2011-02-21 HISTORY — DX: Essential (primary) hypertension: I10

## 2011-02-21 LAB — CBC
Hemoglobin: 11.2 g/dL — ABNORMAL LOW (ref 12.0–15.0)
MCHC: 33 g/dL (ref 30.0–36.0)
MCV: 85.6 fL (ref 78.0–100.0)
Platelets: 241 10*3/uL (ref 150–400)
RDW: 13.5 % (ref 11.5–15.5)
WBC: 5 10*3/uL (ref 4.0–10.5)

## 2011-02-21 LAB — DIFFERENTIAL
Basophils Relative: 1 % (ref 0–1)
Eosinophils Absolute: 0 10*3/uL (ref 0.0–0.7)
Lymphocytes Relative: 33 % (ref 12–46)
Neutrophils Relative %: 49 % (ref 43–77)

## 2011-02-21 MED ORDER — ONDANSETRON HCL 4 MG/2ML IJ SOLN: 4.0000 mg | Freq: Once | INTRAMUSCULAR | Status: DC

## 2011-02-21 MED ORDER — MORPHINE SULFATE 4 MG/ML IJ SOLN: 4.0000 mg | Freq: Once | INTRAMUSCULAR | Status: DC

## 2011-02-21 NOTE — ED Notes (Signed)
Pt refused IV insertion and medications, lab contacted to draw blood.

## 2011-02-21 NOTE — ED Notes (Signed)
C/o abdominal pin and headache

## 2011-02-22 ENCOUNTER — Emergency Department (HOSPITAL_COMMUNITY): Payer: Medicare Other

## 2011-02-22 LAB — COMPREHENSIVE METABOLIC PANEL
ALT: 15 U/L (ref 0–35)
AST: 15 U/L (ref 0–37)
Alkaline Phosphatase: 110 U/L (ref 39–117)
BUN: 12 mg/dL (ref 6–23)
GFR calc Af Amer: >60 mL/min (ref >60)
Glucose, Bld: 95 mg/dL (ref 70–99)
Sodium: 137 mEq/L (ref 135–145)
Total Bilirubin: 0.2 mg/dL — ABNORMAL LOW (ref 0.3–1.2)
Total Protein: 8 g/dL (ref 6.0–8.3)

## 2011-02-22 LAB — URINALYSIS, ROUTINE W REFLEX MICROSCOPIC
Hgb urine dipstick: NEGATIVE
Ketones, ur: NEGATIVE mg/dL
Nitrite: NEGATIVE
pH: 6 (ref 5.0–8.0)

## 2011-02-22 MED ORDER — POTASSIUM CHLORIDE 20 MEQ PO PACK
40.0000 meq | PACK | Freq: Once | ORAL | Status: AC
  Administered 2011-02-22: 40 meq via ORAL
  Filled 2011-02-22: qty 2

## 2011-02-22 NOTE — ED Provider Notes (Signed)
History     CSN: [id number] Arrival date & time: 02/21/2011  9:50 PM   Chief Complaint  Patient presents with  . Abdominal Pain     (Include location/radiation/quality/duration/timing/severity/associated sxs/prior treatment) Patient is a 65 y.o. female presenting with abdominal pain. The history is provided by the patient.  Abdominal Pain The primary symptoms of the illness include abdominal pain. The primary symptoms of the illness do not include fever, shortness of breath, vomiting or dysuria. The current episode started yesterday. The onset of the illness was gradual. The problem has not changed since onset. Associated with: nothing. Change in bowel habit: some constipation which is ongoing. Symptoms associated with the illness do not include chills, diaphoresis, urgency, hematuria, frequency or back pain.   Pain is located in the epigastric region it is not radiating. Pain is mild cramping in nature and has been constant. She denies any fevers or diarrhea. She denies any blood in her stools. There are no aggravating or alleviating factors the patient has noticed. She also has some other complaints tonight. I told this morning she pulled a tick off of her abdomen in the epigastric region she has no associated rash there no fevers. She'll size a mild headache today. She denies any weakness, numbness, neck pain, neck stiffness.  Past Medical History  Diagnosis Date  . Hypertension      Past Surgical History  Procedure Date  . Abdominal hysterectomy   . Abdominal surgery     No family history on file.  History  Substance Use Topics  . Smoking status: Not on file  . Smokeless tobacco: Not on file  . Alcohol Use:     OB History    Grav Para Term Preterm Abortions TAB SAB Ect Mult Living                  Review of Systems  Constitutional: Negative for fever, chills and diaphoresis.  HENT: Negative for neck pain and neck stiffness.   Eyes: Negative for pain.    Respiratory: Negative for shortness of breath.   Cardiovascular: Negative for chest pain, palpitations and leg swelling.  Gastrointestinal: Positive for abdominal pain. Negative for vomiting, blood in stool and abdominal distention.  Genitourinary: Negative for dysuria, urgency, frequency and hematuria.  Musculoskeletal: Negative for back pain.  Skin: Negative for rash.  Neurological: Negative for headaches.  All other systems reviewed and are negative.    Allergies  Aspirin and Penicillins  Home Medications   Current Outpatient Rx  Name Route Sig Dispense Refill  . DIVALPROEX SODIUM 250 MG PO TBEC Oral Take 250 mg by mouth 2 (two) times daily.      Marland Kitchen DOCUSATE SODIUM 100 MG PO CAPS Oral Take 100 mg by mouth 2 (two) times daily.      Marland Kitchen HYDROCHLOROTHIAZIDE 25 MG PO TABS Oral Take 25 mg by mouth daily.      Marland Kitchen OLANZAPINE 20 MG PO TABS Oral Take 20 mg by mouth at bedtime.      . OMEPRAZOLE 20 MG PO CPDR Oral Take 20 mg by mouth daily. For acid relfux     . POTASSIUM CHLORIDE 10 MEQ PO TBCR Oral Take 10 mEq by mouth daily.      Marland Kitchen PRAVASTATIN SODIUM 20 MG PO TABS Oral Take 20 mg by mouth at bedtime.        Physical Exam    BP 125/84  Pulse 65  Temp(Src) 98.5 F (36.9 C) (Oral)  Resp 20  Ht  5\' 4"  (1.626 m)  Wt 137 lb (62.143 kg)  BMI 23.52 kg/m2  SpO2 98%  Physical Exam  Constitutional: She is oriented to person, place, and time. She appears well-developed and well-nourished.  HENT:  Head: Normocephalic and atraumatic.  Eyes: Conjunctivae and EOM are normal. Pupils are equal, round, and reactive to light.  Neck: Trachea normal. Neck supple. No thyromegaly present.  Cardiovascular: Normal rate, regular rhythm, S1 normal, S2 normal and normal pulses.     No systolic murmur is present   No diastolic murmur is present  Pulses:      Radial pulses are 2+ on the right side, and 2+ on the left side.  Pulmonary/Chest: Effort normal and breath sounds normal. She has no wheezes. She  has no rhonchi. She has no rales. She exhibits no tenderness.  Abdominal: Soft. Normal appearance and bowel sounds are normal. There is no CVA tenderness and negative Murphy's sign.       Mild epigastric tenderness. No rebound, guarding or peritonitis. No crepitus, rash or discoloration  Musculoskeletal:       BLE:s Calves nontender, no cords or erythema, negative Homans sign  Neurological: She is alert and oriented to person, place, and time. She has normal strength. No cranial nerve deficit or sensory deficit. GCS eye subscore is 4. GCS verbal subscore is 5. GCS motor subscore is 6.  Skin: Skin is warm and dry. No rash noted. She is not diaphoretic.       ABD area does not demonstrate puncture wound, rash, or evidence of any retained tick parts  Psychiatric: Her speech is normal.       Cooperative and appropriate    ED Course  Procedures  Results for orders placed during the hospital encounter of 02/21/11  CBC      Component Value Range   WBC 5.0  4.0 - 10.5 (K/uL)   RBC 3.96  3.87 - 5.11 (MIL/uL)   Hemoglobin 11.2 (*) 12.0 - 15.0 (g/dL)   HCT 69.6 (*) 29.5 - 46.0 (%)   MCV 85.6  78.0 - 100.0 (fL)   MCH 28.3  26.0 - 34.0 (pg)   MCHC 33.0  30.0 - 36.0 (g/dL)   RDW 28.4  13.2 - 44.0 (%)   Platelets 241  150 - 400 (K/uL)  DIFFERENTIAL      Component Value Range   Neutrophils Relative 49  43 - 77 (%)   Neutro Abs 2.5  1.7 - 7.7 (K/uL)   Lymphocytes Relative 33  12 - 46 (%)   Lymphs Abs 1.6  0.7 - 4.0 (K/uL)   Monocytes Relative 17 (*) 3 - 12 (%)   Monocytes Absolute 0.9  0.1 - 1.0 (K/uL)   Eosinophils Relative 1  0 - 5 (%)   Eosinophils Absolute 0.0  0.0 - 0.7 (K/uL)   Basophils Relative 1  0 - 1 (%)   Basophils Absolute 0.0  0.0 - 0.1 (K/uL)  COMPREHENSIVE METABOLIC PANEL      Component Value Range   Sodium 137  135 - 145 (mEq/L)   Potassium 3.0 (*) 3.5 - 5.1 (mEq/L)   Chloride 97  96 - 112 (mEq/L)   CO2 31  19 - 32 (mEq/L)   Glucose, Bld 95  70 - 99 (mg/dL)   BUN 12  6 -  23 (mg/dL)   Creatinine, Ser 1.02  0.50 - 1.10 (mg/dL)   Calcium 9.3  8.4 - 72.5 (mg/dL)   Total Protein 8.0  6.0 - 8.3 (  g/dL)   Albumin 3.7  3.5 - 5.2 (g/dL)   AST 15  0 - 37 (U/L)   ALT 15  0 - 35 (U/L)   Alkaline Phosphatase 110  39 - 117 (U/L)   Total Bilirubin 0.2 (*) 0.3 - 1.2 (mg/dL)   GFR calc non Af Amer >60  >60 (mL/min)   GFR calc Af Amer >60  >60 (mL/min)  URINALYSIS, ROUTINE W REFLEX MICROSCOPIC      Component Value Range   Color, Urine YELLOW  YELLOW    Appearance CLEAR  CLEAR    Specific Gravity, Urine 1.015  1.005 - 1.030    pH 6.0  5.0 - 8.0    Glucose, UA NEGATIVE  NEGATIVE (mg/dL)   Hgb urine dipstick NEGATIVE  NEGATIVE    Bilirubin Urine NEGATIVE  NEGATIVE    Ketones, ur NEGATIVE  NEGATIVE (mg/dL)   Protein, ur NEGATIVE  NEGATIVE (mg/dL)   Urobilinogen, UA 0.2  0.0 - 1.0 (mg/dL)   Nitrite NEGATIVE  NEGATIVE    Leukocytes, UA NEGATIVE  NEGATIVE   LIPASE, BLOOD      Component Value Range   Lipase 10 (*) 11 - 59 (U/L)   Dg Abd Acute W/chest  02/22/2011  *RADIOLOGY REPORT*  Clinical Data: Abdominal pain  ACUTE ABDOMEN SERIES (ABDOMEN 2 VIEW & CHEST 1 VIEW)  Comparison: 01/25/2009  Findings: Mild bibasilar scarring versus atelectasis.  Mildly prominent cardiac contour.  Aortic arch atherosclerotic calcification.  Tortuosity to the descending aorta.  Nonobstructive bowel gas pattern. Moderate stool burden.  Surgical clips within the mid upper abdomen and right upper quadrant.  Organ outlines normal where seen.  No free intraperitoneal air identified.  Multilevel degenerative changes of the thoracolumbar spine.  No acute osseous abnormality identified.  IMPRESSION: Bibasilar opacities likely scarring or atelectasis.  Heart size upper normal limits to mildly enlarged.  Nonobstructive bowel gas pattern. Moderate stool burden.  Original Report Authenticated By: Waneta Martins, M.D.     No diagnosis found.   MDM [place] female presenting with epigastric discomfort.  Her exam does not demonstrate any findings of acute abdomen. Given her history of multiple abdominal surgeries an x-ray was obtained to look for evidence of free air or air-fluid levels. Her x-rays reviewed as above she was given pain medications and her labs were obtained and reviewed as above. She is afebrile and her vital signs are within normal limits. On recheck at 3:05 AM, her pain is gone and she is feeling much better. Her exam has improved she is no longer tender in her epigastric region. She has a negative Murphy's sign and no right upper quadrant pain or tenderness. Patient is requesting to be discharged home. She reports that she has MiraLAX at home and agrees to take this medication. Patient has followup with her primary care physician Dr. Shelva Majestic. She is a reliable historian and agrees to strict return precautions and followup instructions.      Sunnie Nielsen, MD 02/22/11 [phone]

## 2011-03-16 LAB — CBC
HCT: 37.9
MCHC: 33.6
MCV: 87.6
Platelets: 270
RBC: 4.32
WBC: 4.5

## 2011-03-16 LAB — DIFFERENTIAL
Basophils Relative: 1
Eosinophils Absolute: 0
Eosinophils Relative: 0
Lymphs Abs: 1.1
Monocytes Relative: 14 — ABNORMAL HIGH
Neutrophils Relative %: 62

## 2011-03-16 LAB — BASIC METABOLIC PANEL
BUN: 10
CO2: 31
Chloride: 100
Creatinine, Ser: 0.79
Potassium: 3.7

## 2011-04-09 ENCOUNTER — Encounter (HOSPITAL_COMMUNITY): Payer: Self-pay

## 2011-04-09 ENCOUNTER — Emergency Department (HOSPITAL_COMMUNITY)
Admission: EM | Admit: 2011-04-09 | Discharge: 2011-04-09 | Disposition: A | Discharge status: Home / Self Care | Payer: Medicare Other | Attending: Emergency Medicine | Admitting: Emergency Medicine

## 2011-04-09 DIAGNOSIS — R140 Abdominal distension (gaseous): Secondary | ICD-10-CM

## 2011-04-09 DIAGNOSIS — Z9079 Acquired absence of other genital organ(s): Secondary | ICD-10-CM | POA: Insufficient documentation

## 2011-04-09 DIAGNOSIS — I10 Essential (primary) hypertension: Secondary | ICD-10-CM | POA: Insufficient documentation

## 2011-04-09 DIAGNOSIS — R141 Gas pain: Secondary | ICD-10-CM | POA: Insufficient documentation

## 2011-04-09 DIAGNOSIS — R142 Eructation: Secondary | ICD-10-CM | POA: Insufficient documentation

## 2011-04-09 NOTE — ED Notes (Signed)
Pt states she feels "bloated in her upper abdomin. Reports eating bacon,fried potatoes, cheese with eggs and coffee for breakfast, has not eaten since secondary to abdomin bloating afterwards.  Pt states is starting to feel better.

## 2011-04-09 NOTE — ED Notes (Signed)
Pt advised of the importance of eating a healthy diet.

## 2011-04-09 NOTE — ED Provider Notes (Signed)
Medical screening examination/treatment/procedure(s) were performed by non-physician practitioner and as supervising physician I was immediately available for consultation/collaboration.   Dayton Bailiff, MD 04/09/11 2212

## 2011-04-09 NOTE — ED Notes (Signed)
Pt presents with abdominal bloating. Pt states symptoms started at 1700 today. No vomiting noted.

## 2011-04-09 NOTE — ED Provider Notes (Signed)
History     CSN: [id number] Arrival date & time: 04/09/2011  8:03 PM   First MD Initiated Contact with Patient 04/09/11 2009      Chief Complaint  Patient presents with  . Bloated    (Consider location/radiation/quality/duration/timing/severity/associated sxs/prior treatment) HPI Comments: Pt states she frequently feels bloated as she did this PM.  At exam time she says she is feeling much better and wants to go home.  She has nad no fever or chills.  No n/v/d.  She has taken no meds.  She plans to discuss this with her PCP.  The history is provided by the patient. No language interpreter was used.    Past Medical History  Diagnosis Date  . Hypertension     Past Surgical History  Procedure Date  . Abdominal hysterectomy   . Abdominal surgery     No family history on file.  History  Substance Use Topics  . Smoking status: Never Smoker   . Smokeless tobacco: Not on file  . Alcohol Use: No    OB History    Grav Para Term Preterm Abortions TAB SAB Ect Mult Living                  Review of Systems  Gastrointestinal: Positive for abdominal distention. Negative for nausea, vomiting and diarrhea.  All other systems reviewed and are negative.    Allergies  Aspirin and Penicillins  Home Medications   Current Outpatient Rx  Name Route Sig Dispense Refill  . DIVALPROEX SODIUM 250 MG PO TBEC Oral Take 250 mg by mouth 2 (two) times daily.      Marland Kitchen DOCUSATE SODIUM 100 MG PO CAPS Oral Take 100 mg by mouth 2 (two) times daily.      Marland Kitchen HYDROCHLOROTHIAZIDE 25 MG PO TABS Oral Take 25 mg by mouth daily.      Marland Kitchen OLANZAPINE 20 MG PO TABS Oral Take 20 mg by mouth at bedtime.      . OMEPRAZOLE 20 MG PO CPDR Oral Take 20 mg by mouth daily. For acid relfux     . POTASSIUM CHLORIDE 10 MEQ PO TBCR Oral Take 10 mEq by mouth daily.      Marland Kitchen PRAVASTATIN SODIUM 20 MG PO TABS Oral Take 20 mg by mouth at bedtime.        BP 125/72  Pulse 83  Temp(Src) 98.4 F (36.9 C) (Oral)  Resp 20   Ht 5\' 4"  (1.626 m)  Wt 137 lb (62.143 kg)  BMI 23.52 kg/m2  SpO2 100%  Physical Exam  Nursing note and vitals reviewed. Constitutional: She is oriented to person, place, and time. She appears well-developed and well-nourished. No distress.  HENT:  Head: Normocephalic and atraumatic.  Eyes: EOM are normal.  Neck: Normal range of motion.  Cardiovascular: Normal rate, regular rhythm and normal heart sounds.   Pulmonary/Chest: Effort normal and breath sounds normal.  Abdominal: Soft. Bowel sounds are normal. She exhibits no distension and no mass. There is no tenderness. There is no rebound, no guarding and no CVA tenderness.    Musculoskeletal: Normal range of motion.  Neurological: She is alert and oriented to person, place, and time.  Skin: Skin is warm and dry.  Psychiatric: She has a normal mood and affect. Judgment normal.    ED Course  Procedures (including critical care time)  Labs Reviewed - No data to display No results found.   No diagnosis found.    MDM  Worthy Rancher, PA 04/09/11 2212

## 2011-07-25 ENCOUNTER — Encounter (HOSPITAL_COMMUNITY): Payer: Self-pay | Admitting: Emergency Medicine

## 2011-07-25 ENCOUNTER — Emergency Department (HOSPITAL_COMMUNITY)
Admission: EM | Admit: 2011-07-25 | Discharge: 2011-07-25 | Disposition: A | Discharge status: Home / Self Care | Payer: Medicare Other | Attending: Emergency Medicine | Admitting: Emergency Medicine

## 2011-07-25 DIAGNOSIS — I10 Essential (primary) hypertension: Secondary | ICD-10-CM | POA: Insufficient documentation

## 2011-07-25 DIAGNOSIS — R42 Dizziness and giddiness: Secondary | ICD-10-CM | POA: Insufficient documentation

## 2011-07-25 DIAGNOSIS — Z79899 Other long term (current) drug therapy: Secondary | ICD-10-CM | POA: Insufficient documentation

## 2011-07-25 DIAGNOSIS — R059 Cough, unspecified: Secondary | ICD-10-CM | POA: Insufficient documentation

## 2011-07-25 DIAGNOSIS — K5900 Constipation, unspecified: Secondary | ICD-10-CM | POA: Insufficient documentation

## 2011-07-25 DIAGNOSIS — R05 Cough: Secondary | ICD-10-CM | POA: Insufficient documentation

## 2011-07-25 DIAGNOSIS — R1110 Vomiting, unspecified: Secondary | ICD-10-CM | POA: Insufficient documentation

## 2011-07-25 MED ORDER — MECLIZINE HCL 12.5 MG PO TABS
25.0000 mg | ORAL_TABLET | Freq: Once | ORAL | Status: AC
Start: 2011-07-25 — End: 2011-07-25
  Administered 2011-07-25: 25 mg via ORAL
  Filled 2011-07-25: qty 2

## 2011-07-25 MED ORDER — MECLIZINE HCL 25 MG PO TABS: 25.0000 mg | ORAL_TABLET | Freq: Four times a day (QID) | ORAL | Status: AC

## 2011-07-25 NOTE — ED Notes (Signed)
Dr. Ignacia Palma with pt

## 2011-07-25 NOTE — Discharge Instructions (Signed)

## 2011-07-25 NOTE — ED Notes (Signed)
Pt alert & oriented x4, stable gait. Pt given discharge instructions, paperwork & prescription(s). Patient instructed to stop at the registration desk to finish any additional paperwork. pt verbalized understanding. Pt left department w/ no further questions.  

## 2011-07-25 NOTE — ED Notes (Signed)
Pt c/o dizziness since 12 noon today.

## 2011-07-25 NOTE — ED Notes (Signed)
C/o sudden onset of dizziness that started suddenly this afternoon around noon, states that one of her ears feels full, states that she has had this before.

## 2011-07-25 NOTE — ED Provider Notes (Signed)
This chart was scribed for Carleene Cooper III, MD by Wallis Mart. The patient was seen in room [facility] and the patient's care was started at 10:08 PM.   CSN: [id number]  Arrival date & time 07/25/11  1550   None     Chief Complaint  Patient presents with  . Dizziness    (Consider location/radiation/quality/duration/timing/severity/associated sxs/prior treatment) HPI   Candace [name] is a 66 y.o. female who presents to the Emergency Department complaining of sudden onset, persistence of constant, gradually improving, mild dizziness onset 12 pm today.. Pt feels better while supine.  Pt c/o mild vomiting, coughing, constipaion . Pt denies diarrhea, fever, earache, sore throat, CP, urination , rash, syncope.  Pt is sore in between her eyes.  Pt states she takes meclizine for dizziness but has run out.  There are no other associated symptoms and no other alleviating or aggravating factors.  Pt states her baseline health is usally good.  Pt w/ h/o hypertension, abdominal surgery to remove tumor from stomach.    PCP: Dr. Georgana Curio [facility]   Past Medical History  Diagnosis Date  . Hypertension     Past Surgical History  Procedure Date  . Abdominal hysterectomy   . Abdominal surgery     History reviewed. No pertinent family history.  History  Substance Use Topics  . Smoking status: Never Smoker   . Smokeless tobacco: Not on file  . Alcohol Use: No    OB History    Grav Para Term Preterm Abortions TAB SAB Ect Mult Living                  Review of Systems  10 Systems reviewed and are negative for acute change except as noted in the HPI.  Allergies  Aspirin and Penicillins  Home Medications   Current Outpatient Rx  Name Route Sig Dispense Refill  . DOCUSATE SODIUM 100 MG PO CAPS Oral Take 100 mg by mouth 2 (two) times daily.      Marland Kitchen HYDROCHLOROTHIAZIDE 25 MG PO TABS Oral Take 25 mg by mouth daily.      Marland Kitchen OLANZAPINE 20 MG PO TABS Oral Take  20 mg by mouth at bedtime.      . OMEPRAZOLE 20 MG PO CPDR Oral Take 20 mg by mouth daily. For acid relfux     . POTASSIUM CHLORIDE 10 MEQ PO TBCR Oral Take 10 mEq by mouth daily.      Marland Kitchen PRAVASTATIN SODIUM 20 MG PO TABS Oral Take 20 mg by mouth at bedtime.      Marland Kitchen DIVALPROEX SODIUM 250 MG PO TBEC Oral Take 250 mg by mouth 2 (two) times daily.        BP 139/80  Pulse 80  Temp(Src) 98 F (36.7 C) (Oral)  Resp 20  Ht 5\' 4"  (1.626 m)  Wt 140 lb (63.504 kg)  BMI 24.03 kg/m2  SpO2 100%  Physical Exam  Nursing note and vitals reviewed. Constitutional: She is oriented to person, place, and time. She appears well-developed and well-nourished. No distress.  HENT:  Head: Normocephalic and atraumatic.  Eyes: EOM are normal. Pupils are equal, round, and reactive to light.  Neck: Neck supple. No tracheal deviation present.       No nystagmus lateral gaze  Cardiovascular: Normal rate and regular rhythm.   Pulmonary/Chest: Effort normal and breath sounds normal. No respiratory distress.  Abdominal: Soft. She exhibits no distension.  Musculoskeletal: Normal range of motion. She exhibits no  edema.  Neurological: She is alert and oriented to person, place, and time. No sensory deficit.       Sitting up worsens dizziness, no loss of sensation  Skin: Skin is warm and dry.  Psychiatric: She has a normal mood and affect. Her behavior is normal.    ED Course  Procedures (including critical care time) DIAGNOSTIC STUDIES: Oxygen Saturation is 100% on room air, normal by my interpretation.    COORDINATION OF CARE:  10:31 PM: Pt evaluated, physical exam complete     1. Vertigo      Rx Vertigo with Antivert 25 mg qid x 1 week.  Pt has taken this medicine in the past with relief of her symptoms then.  I personally performed the services described in this documentation, which was scribed in my presence. The recorded information has been reviewed and considered.  Osvaldo Human, M.D.    Carleene Cooper III, MD 07/26/11 1136

## 2011-08-29 ENCOUNTER — Emergency Department (HOSPITAL_COMMUNITY)
Admission: EM | Admit: 2011-08-29 | Discharge: 2011-08-30 | Disposition: A | Discharge status: Home / Self Care | Payer: Medicare Other | Attending: Emergency Medicine | Admitting: Emergency Medicine

## 2011-08-29 ENCOUNTER — Emergency Department (HOSPITAL_COMMUNITY): Payer: Medicare Other

## 2011-08-29 ENCOUNTER — Encounter (HOSPITAL_COMMUNITY): Payer: Self-pay | Admitting: *Deleted

## 2011-08-29 ENCOUNTER — Other Ambulatory Visit: Payer: Self-pay

## 2011-08-29 DIAGNOSIS — R1013 Epigastric pain: Secondary | ICD-10-CM | POA: Insufficient documentation

## 2011-08-29 DIAGNOSIS — K5792 Diverticulitis of intestine, part unspecified, without perforation or abscess without bleeding: Secondary | ICD-10-CM

## 2011-08-29 DIAGNOSIS — K5732 Diverticulitis of large intestine without perforation or abscess without bleeding: Secondary | ICD-10-CM | POA: Insufficient documentation

## 2011-08-29 DIAGNOSIS — I10 Essential (primary) hypertension: Secondary | ICD-10-CM | POA: Insufficient documentation

## 2011-08-29 DIAGNOSIS — N390 Urinary tract infection, site not specified: Secondary | ICD-10-CM

## 2011-08-29 LAB — URINALYSIS, ROUTINE W REFLEX MICROSCOPIC
Bilirubin Urine: NEGATIVE
Hgb urine dipstick: NEGATIVE
Ketones, ur: NEGATIVE mg/dL
Nitrite: NEGATIVE
Protein, ur: NEGATIVE mg/dL
Urobilinogen, UA: 0.2 mg/dL (ref 0.0–1.0)

## 2011-08-29 LAB — URINE MICROSCOPIC-ADD ON

## 2011-08-29 LAB — BASIC METABOLIC PANEL
BUN: 10 mg/dL (ref 6–23)
Calcium: 9.9 mg/dL (ref 8.4–10.5)
Creatinine, Ser: 0.88 mg/dL (ref 0.50–1.10)
GFR calc non Af Amer: 67 mL/min — ABNORMAL LOW (ref >90)
Glucose, Bld: 114 mg/dL — ABNORMAL HIGH (ref 70–99)

## 2011-08-29 LAB — CBC
MCH: 27.6 pg (ref 26.0–34.0)
MCV: 84.3 fL (ref 78.0–100.0)
Platelets: 239 10*3/uL (ref 150–400)
RDW: 13.6 % (ref 11.5–15.5)

## 2011-08-29 LAB — HEPATIC FUNCTION PANEL
AST: 14 U/L (ref 0–37)
Bilirubin, Direct: <0.1 mg/dL (ref 0.0–0.3)
Total Bilirubin: 0.3 mg/dL (ref 0.3–1.2)

## 2011-08-29 LAB — LIPASE, BLOOD: Lipase: 9 U/L — ABNORMAL LOW (ref 11–59)

## 2011-08-29 MED ORDER — SODIUM CHLORIDE 0.9 % IV SOLN: INTRAVENOUS | Status: DC

## 2011-08-29 MED ORDER — FAMOTIDINE IN NACL 20-0.9 MG/50ML-% IV SOLN
20.0000 mg | Freq: Once | INTRAVENOUS | Status: AC
  Administered 2011-08-29: 20 mg via INTRAVENOUS
  Filled 2011-08-29: qty 50

## 2011-08-29 MED ORDER — ONDANSETRON HCL 4 MG/2ML IJ SOLN
4.0000 mg | Freq: Once | INTRAMUSCULAR | Status: AC
  Administered 2011-08-29: 4 mg via INTRAVENOUS
  Filled 2011-08-29: qty 2

## 2011-08-29 NOTE — ED Provider Notes (Signed)
History   This chart was scribed for Candace Jakes, MD by Sofie Rower. The patient was seen in room APA06/APA06 and the patient's care was started at 9:57.    CSN: [id number]  Arrival date & time 08/29/11  2038   First MD Initiated Contact with Patient 08/29/11 2126      Chief Complaint  Patient presents with  . Abdominal Pain    (Consider location/radiation/quality/duration/timing/severity/associated sxs/prior treatment) HPI  Candace [name] is a 66 y.o. female who presents to the Emergency Department complaining of moderate, intermittent abdominal pain located epigastrically onset yesterday (8:00PM) with associated symptoms of soreness, radiating pain to the lower abdomen. Pt states abdominal pain began around 6:00PM. Pt rates the pain at 5/10. Pt has a hx of abdominal surgery (1995), hysterectomy, high blood pressure.  Pt denies vomiting, diarrhea, fever, chest pain, SOB, sore throat, cough, congestion, headache, back pain, neck pain, dysuria, swelling in the legs, rash. Pt is allergic to penicillin and aspirin.   PCP is Dr. Shelva Majestic in [facility].   Past Medical History  Diagnosis Date  . Hypertension     Past Surgical History  Procedure Date  . Abdominal hysterectomy   . Abdominal surgery     No family history on file.  History  Substance Use Topics  . Smoking status: Never Smoker   . Smokeless tobacco: Not on file  . Alcohol Use: No    OB History    Grav Para Term Preterm Abortions TAB SAB Ect Mult Living                  Review of Systems  All other systems reviewed and are negative.    10 Systems reviewed and are negative for acute change except as noted in the HPI.   Allergies  Aspirin and Penicillins  Home Medications   Current Outpatient Rx  Name Route Sig Dispense Refill  . DOCUSATE SODIUM 100 MG PO CAPS Oral Take 100 mg by mouth 2 (two) times daily.      Marland Kitchen HYDROCHLOROTHIAZIDE 25 MG PO TABS Oral Take 25 mg by mouth every morning.     Marland Kitchen  MECLIZINE HCL 25 MG PO TABS Oral Take 25 mg by mouth 3 (three) times daily as needed. For dizziness    . OLANZAPINE 20 MG PO TABS Oral Take 20 mg by mouth at bedtime.      . OMEPRAZOLE 20 MG PO CPDR Oral Take 20 mg by mouth every morning. For acid relfux    . POTASSIUM CHLORIDE 10 MEQ PO TBCR Oral Take 10 mEq by mouth every morning.     Marland Kitchen PRAVASTATIN SODIUM 20 MG PO TABS Oral Take 20 mg by mouth at bedtime.        BP 118/64  Pulse 104  Temp(Src) 98.2 F (36.8 C) (Oral)  Resp 16  Ht 5\' 4"  (1.626 m)  Wt 139 lb (63.05 kg)  BMI 23.86 kg/m2  SpO2 100%  Physical Exam  Nursing note and vitals reviewed. Constitutional: She is oriented to person, place, and time. She appears well-developed. No distress.  HENT:  Head: Normocephalic and atraumatic.  Right Ear: External ear normal.  Left Ear: External ear normal.  Nose: Nose normal.       Mucus membranes dry.   Eyes: Conjunctivae and EOM are normal. No scleral icterus.  Neck: Neck supple. No thyromegaly present.  Cardiovascular: Normal rate, regular rhythm and normal heart sounds.  Exam reveals no gallop and no friction rub.  No murmur heard. Pulmonary/Chest: Breath sounds normal. No stridor. She has no wheezes. She has no rales. She exhibits no tenderness.  Abdominal: She exhibits no distension. There is tenderness (Epigastic. ). There is no rebound and no guarding.       Bowel sounds decreased.   Musculoskeletal: Normal range of motion. She exhibits no edema.  Lymphadenopathy:    She has no cervical adenopathy.  Neurological: She is alert and oriented to person, place, and time. Coordination normal.  Skin: Skin is warm and dry. No rash noted. No erythema.  Psychiatric: She has a normal mood and affect. Her behavior is normal.    ED Course  Procedures (including critical care time)  DIAGNOSTIC STUDIES: Oxygen Saturation is 100% on room air, normal by my interpretation.    COORDINATION OF CARE:    Results for orders placed  during the hospital encounter of 08/29/11  CBC      Component Value Range   WBC 8.6  4.0 - 10.5 (K/uL)   RBC 4.39  3.87 - 5.11 (MIL/uL)   Hemoglobin 12.1  12.0 - 15.0 (g/dL)   HCT 30.8  65.7 - 84.6 (%)   MCV 84.3  78.0 - 100.0 (fL)   MCH 27.6  26.0 - 34.0 (pg)   MCHC 32.7  30.0 - 36.0 (g/dL)   RDW 96.2  95.2 - 84.1 (%)   Platelets 239  150 - 400 (K/uL)  LIPASE, BLOOD      Component Value Range   Lipase 9 (*) 11 - 59 (U/L)  URINALYSIS, ROUTINE W REFLEX MICROSCOPIC      Component Value Range   Color, Urine YELLOW  YELLOW    APPearance CLEAR  CLEAR    Specific Gravity, Urine 1.020  1.005 - 1.030    pH 5.0  5.0 - 8.0    Glucose, UA NEGATIVE  NEGATIVE (mg/dL)   Hgb urine dipstick NEGATIVE  NEGATIVE    Bilirubin Urine NEGATIVE  NEGATIVE    Ketones, ur NEGATIVE  NEGATIVE (mg/dL)   Protein, ur NEGATIVE  NEGATIVE (mg/dL)   Urobilinogen, UA 0.2  0.0 - 1.0 (mg/dL)   Nitrite NEGATIVE  NEGATIVE    Leukocytes, UA SMALL (*) NEGATIVE   BASIC METABOLIC PANEL      Component Value Range   Sodium 138  135 - 145 (mEq/L)   Potassium 3.2 (*) 3.5 - 5.1 (mEq/L)   Chloride 99  96 - 112 (mEq/L)   CO2 31  19 - 32 (mEq/L)   Glucose, Bld 114 (*) 70 - 99 (mg/dL)   BUN 10  6 - 23 (mg/dL)   Creatinine, Ser 3.24  0.50 - 1.10 (mg/dL)   Calcium 9.9  8.4 - 40.1 (mg/dL)   GFR calc non Af Amer 67 (*) >90 (mL/min)   GFR calc Af Amer 78 (*) >90 (mL/min)  HEPATIC FUNCTION PANEL      Component Value Range   Total Protein 7.9  6.0 - 8.3 (g/dL)   Albumin 3.7  3.5 - 5.2 (g/dL)   AST 14  0 - 37 (U/L)   ALT 11  0 - 35 (U/L)   Alkaline Phosphatase 115  39 - 117 (U/L)   Total Bilirubin 0.3  0.3 - 1.2 (mg/dL)   Bilirubin, Direct <0.2  0.0 - 0.3 (mg/dL)   Indirect Bilirubin NOT CALCULATED  0.3 - 0.9 (mg/dL)  URINE MICROSCOPIC-ADD ON      Component Value Range   WBC, UA 7-10  <3 (WBC/hpf)   RBC /  HPF 0-2  <3 (RBC/hpf)   Casts GRANULAR CAST (*) NEGATIVE    Dg Chest 2 View  08/29/2011  *RADIOLOGY REPORT*   Clinical Data: 66 year old female with chest pain.  CHEST - 2 VIEW  Comparison: 02/22/2011  Findings: The cardiomediastinal silhouette is unremarkable. Mild elevation of the right hemidiaphragm is unchanged. There is no evidence of focal airspace disease, pulmonary edema, suspicious pulmonary nodule/mass, pleural effusion, or pneumothorax. No acute bony abnormalities are identified.  IMPRESSION: No evidence of acute cardiopulmonary disease.  Original Report Authenticated By: Rosendo Gros, M.D.    Date: 08/29/2011  Rate: 91  Rhythm: normal sinus rhythm  QRS Axis: normal  Intervals: normal  ST/T Wave abnormalities: nonspecific ST/T changes  Conduction Disutrbances:none  Narrative Interpretation:   Old EKG Reviewed: none available     1. Epigastric abdominal pain     10:01PM- EDP at bedside discusses treatment plan.   MDM  Patient with onset of epigastric abdominal pain yesterday at about 6 in the evening patient had the abdominal surgery in the past to include a total hysterectomy and also for removal of benign tumor. CT scan is pending to rule out any significant intra-abdominal process. Patient given Pepcid in case of gastritis related no nausea no vomiting labs without leukocytosis no evidence of pancreatitis liver function tests the without evidence of hepatitis. If CT scan is negative patient be discharged home with Pepcid and some pain medicine. Patient is followed by the [facility].  In emergency apartment patient appears nontoxic no acute distress abdomen with mild tenderness in epigastric area no guarding not consistent with acute surgical abdomen.  Urinalysis suggestive of urinary tract infection will treat with Cipro 500 mg here and a prescription to go home without. Urine culture sent. CT scan results still pending.    I personally performed the services described in this documentation, which was scribed in my presence. The recorded information has been  reviewed and considered.     Candace Jakes, MD 08/30/11 [phone]

## 2011-08-29 NOTE — ED Notes (Addendum)
Labs obtained when IV started.  Advised patient of need for urine specimen when she is able to void

## 2011-08-29 NOTE — ED Notes (Signed)
Vomited approx 30 cc of oral contrast media.  Medicated patient and obtained 12 lead EKG as ordered

## 2011-08-29 NOTE — ED Notes (Signed)
Abdominal pain

## 2011-08-29 NOTE — ED Notes (Signed)
Denies nausea or vomiting

## 2011-08-30 LAB — URINE CULTURE: Culture  Setup Time: 201303260125

## 2011-08-30 MED ORDER — IOHEXOL 300 MG/ML  SOLN
100.0000 mL | Freq: Once | INTRAMUSCULAR | Status: AC | PRN
  Administered 2011-08-30: 100 mL via INTRAVENOUS

## 2011-08-30 MED ORDER — CIPROFLOXACIN HCL 250 MG PO TABS
500.0000 mg | ORAL_TABLET | Freq: Once | ORAL | Status: AC
  Administered 2011-08-30: 500 mg via ORAL
  Filled 2011-08-30: qty 2

## 2011-08-30 MED ORDER — PROMETHAZINE HCL 25 MG PO TABS: 25.0000 mg | ORAL_TABLET | Freq: Four times a day (QID) | ORAL | Status: AC | PRN

## 2011-08-30 MED ORDER — METRONIDAZOLE 500 MG PO TABS: 500.0000 mg | ORAL_TABLET | Freq: Three times a day (TID) | ORAL | Status: AC

## 2011-08-30 MED ORDER — CIPROFLOXACIN HCL 500 MG PO TABS: 500.0000 mg | ORAL_TABLET | Freq: Two times a day (BID) | ORAL | Status: AC

## 2011-08-30 MED ORDER — HYDROCODONE-ACETAMINOPHEN 5-325 MG PO TABS: 1.0000 | ORAL_TABLET | Freq: Four times a day (QID) | ORAL | Status: DC | PRN

## 2011-08-30 NOTE — Discharge Instructions (Signed)
Take antibiotic as directed until completed. Urine culture sent and pending. Follow up with your md Wednesday for recheck

## 2011-08-30 NOTE — ED Provider Notes (Signed)
Pt with diverticulitis.  Pt wanted to be treated as an out patient  Benny Lennert, MD 08/30/11 [phone]

## 2011-08-30 NOTE — ED Notes (Signed)
Assumed care for discharge.  Discharge instructions given and reviewed with patient.  Prescriptions given for Cipro, Flagyl, Hydrocodone and Phenergan.  Patient instructed to complete all antibiotic and sedating effects of Hydrocodone and Phenergan.  Patient ambulatory with steady gait; discharged home in good condition.  Friend accompanied discharge to drive home.

## 2011-09-08 ENCOUNTER — Encounter (HOSPITAL_COMMUNITY): Payer: Self-pay | Admitting: *Deleted

## 2011-09-08 ENCOUNTER — Emergency Department (HOSPITAL_COMMUNITY)
Admission: EM | Admit: 2011-09-08 | Discharge: 2011-09-08 | Disposition: A | Discharge status: Home / Self Care | Payer: Medicare Other | Attending: Emergency Medicine | Admitting: Emergency Medicine

## 2011-09-08 DIAGNOSIS — M6208 Separation of muscle (nontraumatic), other site: Secondary | ICD-10-CM

## 2011-09-08 DIAGNOSIS — K2990 Gastroduodenitis, unspecified, without bleeding: Secondary | ICD-10-CM | POA: Insufficient documentation

## 2011-09-08 DIAGNOSIS — I10 Essential (primary) hypertension: Secondary | ICD-10-CM | POA: Insufficient documentation

## 2011-09-08 DIAGNOSIS — K439 Ventral hernia without obstruction or gangrene: Secondary | ICD-10-CM | POA: Insufficient documentation

## 2011-09-08 DIAGNOSIS — K2970 Gastritis, unspecified, without bleeding: Secondary | ICD-10-CM | POA: Insufficient documentation

## 2011-09-08 DIAGNOSIS — Z9079 Acquired absence of other genital organ(s): Secondary | ICD-10-CM | POA: Insufficient documentation

## 2011-09-08 DIAGNOSIS — M6200 Separation of muscle (nontraumatic), unspecified site: Secondary | ICD-10-CM | POA: Insufficient documentation

## 2011-09-08 MED ORDER — FAMOTIDINE 20 MG PO TABS
20.0000 mg | ORAL_TABLET | Freq: Once | ORAL | Status: AC
  Administered 2011-09-08: 20 mg via ORAL
  Filled 2011-09-08: qty 1

## 2011-09-08 MED ORDER — FAMOTIDINE 20 MG PO TABS: 20.0000 mg | ORAL_TABLET | Freq: Two times a day (BID) | ORAL | Status: DC

## 2011-09-08 NOTE — Discharge Instructions (Signed)
Gastritis Gastritis is an inflammation (the body's way of reacting to injury and/or infection) of the stomach. It is often caused by viral or bacterial (germ) infections. It can also be caused by chemicals (including alcohol) and medications. This illness may be associated with generalized malaise (feeling tired, not well), cramps, and fever. The illness may last 2 to 7 days. If symptoms of gastritis continue, gastroscopy (looking into the stomach with a telescope-like instrument), biopsy (taking tissue samples), and/or blood tests may be necessary to determine the cause. Antibiotics will not affect the illness unless there is a bacterial infection present. One common bacterial cause of gastritis is an organism known as H. Pylori. This can be treated with antibiotics. Other forms of gastritis are caused by too much acid in the stomach. They can be treated with medications such as H2 blockers and antacids. Home treatment is usually all that is needed. Young children will quickly become dehydrated (loss of body fluids) if vomiting and diarrhea are both present. Medications may be given to control nausea. Medications are usually not given for diarrhea unless especially bothersome. Some medications slow the removal of the virus from the gastrointestinal tract. This slows down the healing process. HOME CARE INSTRUCTIONS Home care instructions for nausea and vomiting:  For adults: drink small amounts of fluids often. Drink at least 2 quarts a day. Take sips frequently. Do not drink large amounts of fluid at one time. This may worsen the nausea.   Only take over-the-counter or prescription medicines for pain, discomfort, or fever as directed by your caregiver.   Drink clear liquids only. Those are anything you can see through such as water, broth, or soft drinks.   Once you are keeping clear liquids down, you may start full liquids, soups, juices, and ice cream or sherbet. Slowly add bland (plain, not spicy)  foods to your diet.  Home care instructions for diarrhea:  Diarrhea can be caused by bacterial infections or a virus. Your condition should improve with time, rest, fluids, and/or anti-diarrheal medication.   Until your diarrhea is under control, you should drink clear liquids often in small amounts. Clear liquids include: water, broth, jell-o water and weak tea.  Avoid:  Milk.   Fruits.   Tobacco.   Alcohol.   Extremely hot or cold fluids.   Too much intake of anything at one time.  When your diarrhea stops you may add the following foods, which help the stool to become more formed:  Rice.   Bananas.   Apples without skin.   Dry toast.  Once these foods are tolerated you may add low-fat yogurt and low-fat cottage cheese. They will help to restore the normal bacterial balance in your bowel. Wash your hands well to avoid spreading bacteria (germ) or virus. SEEK IMMEDIATE MEDICAL CARE IF:   You are unable to keep fluids down.   Vomiting or diarrhea become persistent (constant).   Abdominal pain develops, increases, or localizes. (Right sided pain can be appendicitis. Left sided pain in adults can be diverticulitis.)   You develop a fever (an oral temperature above 102 F (38.9 C)).   Diarrhea becomes excessive or contains blood or mucus.   You have excessive weakness, dizziness, fainting or extreme thirst.   You are not improving or you are getting worse.   You have any other questions or concerns.  Document Released: 05/17/2001 Document Revised: 05/12/2011 Document Reviewed: 05/23/2005 [employer] Patient Information 2012 [employer]. 

## 2011-09-08 NOTE — ED Notes (Addendum)
Upper abd pain, says she was seen here for same on3/25 Has taken all her med.  No NVD or fever.  Says she is not having pain now,but  Feels swollen and bloated.

## 2011-09-08 NOTE — ED Provider Notes (Signed)
History   This chart was scribed for Candace Bonier, MD by Sofie Rower. The patient was seen in room APA12/APA12 and the patient's care was started at     [facility]: [id number]  Arrival date & time 09/08/11  1421   First MD Initiated Contact with Patient 09/08/11 1517      Chief Complaint  Patient presents with  . Abdominal Pain    (Consider location/radiation/quality/duration/timing/severity/associated sxs/prior treatment) HPI  Candace [name] is a 66 y.o. female who presents to the Emergency Department complaining of moderate, episodic abdominal pain located episgastically onset 2-3 days ago with associated symptoms of constipation, loss of appetite  Pt states she had a cat scan done two weeks ago, diagnosed with diverticulitis, states "it feels like there is a lump in her stomach. Pt also complains of a moderate, constant lump in her abdomen located epigastrically, manually palpable but not painful or symptomatic at rest when not palpating it.   Modifying factors include recent course of antibiotic medication that she thinks upset her stomach, patients own palpitation which intensifies the pain.   Pt has a hx of tumor removal (1995),   Pt denies blood in stool,   PCP is Dr. Shelva Majestic. Gastroenterologist is Dr. Karilyn Cota.    Past Medical History  Diagnosis Date  . Hypertension     Past Surgical History  Procedure Date  . Abdominal hysterectomy   . Abdominal surgery       History  Substance Use Topics  . Smoking status: Never Smoker   . Smokeless tobacco: Not on file  . Alcohol Use: No    OB History    Grav Para Term Preterm Abortions TAB SAB Ect Mult Living                  Review of Systems  All other systems reviewed and are negative.    10 Systems reviewed and all are negative for acute change except as noted in the HPI.    Allergies  Aspirin and Penicillins  Home Medications   Current Outpatient Rx  Name Route Sig Dispense Refill  . CIPROFLOXACIN HCL 500  MG PO TABS Oral Take 1 tablet (500 mg total) by mouth 2 (two) times daily. 14 tablet 0  . DOCUSATE SODIUM 100 MG PO CAPS Oral Take 100 mg by mouth 2 (two) times daily.      Marland Kitchen HYDROCHLOROTHIAZIDE 25 MG PO TABS Oral Take 25 mg by mouth every morning.     Marland Kitchen HYDROCODONE-ACETAMINOPHEN 5-325 MG PO TABS Oral Take 1 tablet by mouth every 6 (six) hours as needed for pain. 20 tablet 0  . MECLIZINE HCL 25 MG PO TABS Oral Take 25 mg by mouth 3 (three) times daily as needed. For dizziness    . METRONIDAZOLE 500 MG PO TABS Oral Take 1 tablet (500 mg total) by mouth 3 (three) times daily. 21 tablet 0  . OLANZAPINE 20 MG PO TABS Oral Take 20 mg by mouth at bedtime.      . OMEPRAZOLE 20 MG PO CPDR Oral Take 20 mg by mouth every morning. For acid relfux    . POTASSIUM CHLORIDE 10 MEQ PO TBCR Oral Take 10 mEq by mouth every morning.     Marland Kitchen PRAVASTATIN SODIUM 20 MG PO TABS Oral Take 20 mg by mouth at bedtime.        BP 127/87  Pulse 74  Temp(Src) 98.1 F (36.7 C) (Oral)  Resp 20  Ht 5\' 4"  (1.626 m)  Wt 134 lb (60.782 kg)  BMI 23.00 kg/m2  SpO2 100%  Physical Exam  Nursing note and vitals reviewed. Constitutional: She is oriented to person, place, and time. She appears well-developed and well-nourished.  HENT:  Head: Normocephalic and atraumatic.  Nose: Nose normal.  Mouth/Throat: Oropharynx is clear and moist.  Eyes: Conjunctivae and EOM are normal. No scleral icterus.  Neck: Neck supple. No thyromegaly present.  Cardiovascular: Normal rate, regular rhythm and normal heart sounds.  Exam reveals no gallop and no friction rub.   No murmur heard. Pulmonary/Chest: Breath sounds normal. No stridor. She has no wheezes. She has no rales. She exhibits no tenderness.  Abdominal: Bowel sounds are normal. She exhibits no distension. There is no tenderness. There is no rebound.       Separation of abdominal muscles. 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   Musculoskeletal: Normal range of  motion. She exhibits no edema.  Lymphadenopathy:    She has no cervical adenopathy.  Neurological: She is alert and oriented to person, place, and time. Coordination normal.  Skin: Skin is warm and dry. No rash noted. No erythema.  Psychiatric: She has a normal mood and affect. Her behavior is normal.    ED Course  Procedures (including critical care time)  DIAGNOSTIC STUDIES: Oxygen Saturation is 100% on room air, normal by my interpretation.    COORDINATION OF CARE:     Labs Reviewed - No data to display No results found.   No diagnosis found.  4:03PM- EDP at bedside discusses treatment plan. EDP reviews CT scan showing diverticulitis with no abscess. EDP informs pt of Hiatal hernia.  4:06PM- EDP reviews pt chest x-ray from march 26th 2013.   MDM    I've reviewed the CT scan from approximately one to 2 weeks ago when the patient was diagnosed with diverticulitis and there is no mention of tumor in the upper gastrointestinal tract to explain the mass that the patient reports feeling. I do feel that the patient is concerned about, and this is a diastases recti with mild ventral hernia, reducible. It is not tender and her examination is not suggestive of bowel obstruction, pancreatitis, hepatitis, cholecystitis, urinary tract infection, intra-abdominal or pericolonic abscess or worsening diverticulitis. I do believe the patient has gastritis from the recent antibiotic course now prescribed her Pepcid to treat this. Otherwise I am referring the patient back to Dr. Karilyn Cota for further followup evaluation and management of symptoms up to and including possible repeat colonoscopy or endoscopy as indicated. The patient states her understanding of and agreement with the plan of care. Her symptoms are controlled she is stable for discharge home. She is tolerating oral intake without difficulty.     I personally performed the services described in this documentation, which was scribed in my  presence. The recorded information has been reviewed and considered.    Candace Bonier, MD 09/08/11 [phone]

## 2011-09-08 NOTE — ED Notes (Signed)
Patient with no complaints at this time. Respirations even and unlabored. Skin warm/dry. Discharge instructions reviewed with patient at this time. Patient given opportunity to voice concerns/ask questions. IV removed per policy and band-aid applied to site. Patient discharged at this time and left Emergency Department with steady gait.  

## 2011-10-06 ENCOUNTER — Ambulatory Visit (INDEPENDENT_AMBULATORY_CARE_PROVIDER_SITE_OTHER): Payer: Medicare Other | Admitting: Internal Medicine

## 2011-10-13 ENCOUNTER — Encounter (INDEPENDENT_AMBULATORY_CARE_PROVIDER_SITE_OTHER): Payer: Self-pay | Admitting: *Deleted

## 2011-10-13 ENCOUNTER — Encounter (INDEPENDENT_AMBULATORY_CARE_PROVIDER_SITE_OTHER): Payer: Self-pay | Admitting: Internal Medicine

## 2011-10-13 ENCOUNTER — Ambulatory Visit (INDEPENDENT_AMBULATORY_CARE_PROVIDER_SITE_OTHER): Payer: Medicare Other | Admitting: Internal Medicine

## 2011-10-13 ENCOUNTER — Other Ambulatory Visit (INDEPENDENT_AMBULATORY_CARE_PROVIDER_SITE_OTHER): Payer: Self-pay | Admitting: *Deleted

## 2011-10-13 VITALS — BP 126/84 | HR 66 | Temp 98.6°F | Ht 64.0 in | Wt 133.1 lb

## 2011-10-13 DIAGNOSIS — R6881 Early satiety: Secondary | ICD-10-CM

## 2011-10-13 DIAGNOSIS — I10 Essential (primary) hypertension: Secondary | ICD-10-CM

## 2011-10-13 DIAGNOSIS — K219 Gastro-esophageal reflux disease without esophagitis: Secondary | ICD-10-CM | POA: Insufficient documentation

## 2011-10-13 DIAGNOSIS — K2210 Ulcer of esophagus without bleeding: Secondary | ICD-10-CM | POA: Insufficient documentation

## 2011-10-13 DIAGNOSIS — K208 Other esophagitis without bleeding: Secondary | ICD-10-CM

## 2011-10-13 NOTE — Patient Instructions (Signed)
EGD with Dr. Karilyn Cota. Omeprazole 20mg  BID

## 2011-10-13 NOTE — Progress Notes (Signed)
Subjective:     Patient ID: Candace [name], female   DOB: 11/25/1945, 66 y.o.   MRN: [id number]  Candace [name] is a 66 yr old female presenting today with c/o of early satiety. Symptoms for 2 weeks.  She is eatig 2 meals a day.  She has acid reflux at night. Appetite is good. She has actually gained about 7 pounds since her last office visit. She denies any pain.  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  She also has a h of gastric ulcer diagnosed back in 2007.  She has been treated for H. Pylori in the past.  Her last EGD was in  February of 2011 which revealed erosive esophagitis. 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  Patient gastroduodenostomy without PUD or stricutre.    Review of Systems see hpi Current Outpatient Prescriptions  Medication Sig Dispense Refill  . docusate sodium (COLACE) 100 MG capsule Take 100 mg by mouth 2 (two) times daily.        . famotidine (PEPCID) 20 MG tablet Take 1 tablet (20 mg total) by mouth 2 (two) times daily. For 2 weeks  28 tablet  0  . hydrochlorothiazide (HYDRODIURIL) 25 MG tablet Take 25 mg by mouth every morning.       . meclizine (ANTIVERT) 25 MG tablet Take 25 mg by mouth 3 (three) times daily as needed. For dizziness      . OLANZapine (ZYPREXA) 20 MG tablet Take 20 mg by mouth at bedtime.        Marland Kitchen omeprazole (PRILOSEC) 20 MG capsule Take 20 mg by mouth every morning. For acid relfux      . potassium chloride (KLOR-CON) 10 MEQ CR tablet Take 10 mEq by mouth every morning.       . pravastatin (PRAVACHOL) 20 MG tablet Take 20 mg by mouth at bedtime.        Marland Kitchen DISCONTD: divalproex (DEPAKOTE) 250 MG EC tablet Take 250 mg by mouth 2 (two) times daily.         Past Medical History  Diagnosis Date  . Hypertension   . GERD (gastroesophageal reflux disease)   . Erosive esophagitis   . Hypertension    Past Surgical History  Procedure Date  . Abdominal  hysterectomy   . Abdominal surgery   . 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 Marital Status: Single    Spouse Name: N/A    Number of Children: N/A  . Years of Education: N/A   Occupational History  . Not on file.   Social History Main Topics  . Smoking status: Never Smoker   . Smokeless tobacco: Not on file  . Alcohol Use: No  . Drug Use: No  . Sexually Active:    Other Topics Concern  . Not on file   Social History Narrative  . No narrative on file   Family Status  Relation Status Death Age  . Mother Alive     good health  . Father Deceased     Protate cancer  . Sister Alive     good health  . Brother Alive     good health   Allergies  Allergen Reactions  . Aspirin Itching and Swelling  . Penicillins Itching and Swelling  Objective:   Physical Exam Filed Vitals:   10/13/11 1429  Height: 5\' 4"  (1.626 m)  Weight: 133 lb 1.6 oz (60.374 kg)  Alert and oriented. Skin warm and dry. Oral mucosa is moist.   . Sclera anicteric, conjunctivae is pink. Thyroid not enlarged. No cervical lymphadenopathy. Lungs clear. Heart regular rate and rhythm.  Abdomen is soft. Bowel sounds are positive. No hepatomegaly. No abdominal masses felt. No tenderness.  No edema to lower extremities. Patient is alert and oriented.       Assessment:    Early Satiey. No abdominal pain. PUD needs to be ruled out as well as erosive esohpagitis    Plan:     EGD with Dr. Karilyn Cota. The risks and benefits such as perforation, bleeding, and infection were reviewed with the patient and is agreeable.

## 2011-10-18 ENCOUNTER — Encounter (INDEPENDENT_AMBULATORY_CARE_PROVIDER_SITE_OTHER): Payer: Self-pay | Admitting: *Deleted

## 2011-11-10 ENCOUNTER — Ambulatory Visit (HOSPITAL_COMMUNITY)
Admission: RE | Admit: 2011-11-10 | Discharge: 2011-11-10 | Disposition: A | Discharge status: Home / Self Care | Payer: Medicare Other | Source: Ambulatory Visit | Attending: Internal Medicine | Admitting: Internal Medicine

## 2011-11-10 ENCOUNTER — Encounter (HOSPITAL_COMMUNITY): Payer: Self-pay | Admitting: *Deleted

## 2011-11-10 ENCOUNTER — Other Ambulatory Visit (INDEPENDENT_AMBULATORY_CARE_PROVIDER_SITE_OTHER): Payer: Self-pay | Admitting: Internal Medicine

## 2011-11-10 ENCOUNTER — Encounter (HOSPITAL_COMMUNITY)
Admission: RE | Disposition: A | Discharge status: Home / Self Care | Payer: Self-pay | Source: Ambulatory Visit | Attending: Internal Medicine

## 2011-11-10 DIAGNOSIS — K449 Diaphragmatic hernia without obstruction or gangrene: Secondary | ICD-10-CM | POA: Insufficient documentation

## 2011-11-10 DIAGNOSIS — D131 Benign neoplasm of stomach: Secondary | ICD-10-CM

## 2011-11-10 DIAGNOSIS — K219 Gastro-esophageal reflux disease without esophagitis: Secondary | ICD-10-CM

## 2011-11-10 DIAGNOSIS — K2289 Other specified disease of esophagus: Secondary | ICD-10-CM

## 2011-11-10 DIAGNOSIS — K228 Other specified diseases of esophagus: Secondary | ICD-10-CM

## 2011-11-10 DIAGNOSIS — I10 Essential (primary) hypertension: Secondary | ICD-10-CM | POA: Insufficient documentation

## 2011-11-10 DIAGNOSIS — Z980 Intestinal bypass and anastomosis status: Secondary | ICD-10-CM | POA: Insufficient documentation

## 2011-11-10 DIAGNOSIS — Z79899 Other long term (current) drug therapy: Secondary | ICD-10-CM | POA: Insufficient documentation

## 2011-11-10 DIAGNOSIS — R6881 Early satiety: Secondary | ICD-10-CM

## 2011-11-10 DIAGNOSIS — Z8711 Personal history of peptic ulcer disease: Secondary | ICD-10-CM

## 2011-11-10 DIAGNOSIS — B3781 Candidal esophagitis: Secondary | ICD-10-CM

## 2011-11-10 HISTORY — PX: ESOPHAGOGASTRODUODENOSCOPY: SHX5428

## 2011-11-10 LAB — KOH PREP

## 2011-11-10 SURGERY — EGD (ESOPHAGOGASTRODUODENOSCOPY)
Anesthesia: Moderate Sedation

## 2011-11-10 MED ORDER — MIDAZOLAM HCL 5 MG/5ML IJ SOLN
INTRAMUSCULAR | Status: AC
  Filled 2011-11-10: qty 10

## 2011-11-10 MED ORDER — NYSTATIN 100000 UNIT/ML MT SUSP: 500000.0000 [IU] | Freq: Four times a day (QID) | OROMUCOSAL | Status: DC

## 2011-11-10 MED ORDER — MIDAZOLAM HCL 5 MG/5ML IJ SOLN
INTRAMUSCULAR | Status: DC | PRN
  Administered 2011-11-10: 1 mg via INTRAVENOUS
  Administered 2011-11-10: 2 mg via INTRAVENOUS

## 2011-11-10 MED ORDER — BUTAMBEN-TETRACAINE-BENZOCAINE 2-2-14 % EX AERO
INHALATION_SPRAY | CUTANEOUS | Status: DC | PRN
  Administered 2011-11-10: 1 via TOPICAL

## 2011-11-10 MED ORDER — MEPERIDINE HCL 25 MG/ML IJ SOLN
INTRAMUSCULAR | Status: DC | PRN
  Administered 2011-11-10 (×2): 25 mg via INTRAVENOUS

## 2011-11-10 MED ORDER — SODIUM CHLORIDE 0.9 % IJ SOLN
INTRAMUSCULAR | Status: AC
  Filled 2011-11-10: qty 30

## 2011-11-10 MED ORDER — SODIUM CHLORIDE 0.45 % IV SOLN
Freq: Once | INTRAVENOUS | Status: AC
  Administered 2011-11-10: 14:00:00 via INTRAVENOUS

## 2011-11-10 MED ORDER — STERILE WATER FOR IRRIGATION IR SOLN
Status: DC | PRN
  Administered 2011-11-10: 15:00:00

## 2011-11-10 MED ORDER — MEPERIDINE HCL 50 MG/ML IJ SOLN
INTRAMUSCULAR | Status: AC
  Filled 2011-11-10: qty 1

## 2011-11-10 MED ORDER — SUCRALFATE 1 GM/10ML PO SUSP: 2.0000 g | Freq: Every day | ORAL | Status: DC

## 2011-11-10 NOTE — Op Note (Signed)
EGD PROCEDURE REPORT  PATIENT:  Candace [name]  MR#:  [id number] Birthdate:  12/29/1945, 66 y.o., female Endoscopist:  Dr. Malissa Hippo, MD Referred By:  Dr. Forest Gleason, MD Procedure Date: 11/10/2011  Procedure:   EGD  Indications:  Patient is 66 year old African American female with one month history of early satiety. 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 Patient has been treated for H. pylori infection.            Informed Consent:  The risks, benefits, alternatives & imponderables which include, but are not limited to, bleeding, infection, perforation, drug reaction and potential missed lesion have been reviewed.  The potential for biopsy, lesion removal, esophageal dilation, etc. have also been discussed.  Questions have been answered.  All parties agreeable.  Please see history & physical in medical record for more information.  Medications:  Demerol 50 mg IV Versed 3 mg IV Cetacaine spray topically for oropharyngeal anesthesia  Description of procedure:  The endoscope was introduced through the mouth and advanced to the second portion of the duodenum without difficulty or limitations. The mucosal surfaces were surveyed very carefully during advancement of the scope and upon withdrawal.  Findings:  Esophagus:  Mucosa of the proximal segment was normal. The scope third set patchy cheesy exudate. Brushing was taken for KOH prep. Spontaneous reflux of bile noted into the esophagus. GEJ:  30 cm Hiatus:  32 cm Stomach:  Very small gastric pouch with patent gastrojejunostomy. I was able to retroflex the scope in the gastric pouch and fundus and cardia was normal. 7 mm polyp noted proximal to anastomosis and was snared and retrieved for histologic examination. Vocal scarring noted proximal to anastomosis as well. Duodenum:  Normal bulbar mucosa. Normal mucosa of the second and third part of the duodenum.  Ampulla of water was also seen and appear to be normal.  Therapeutic/Diagnostic Maneuvers Performed:  Snare polypectomy of a gastric polyp.  Complications:  None  Impression: Patchy whitish exudate coating mucosa of distal half of the esophagus. Brushing taken for KOH prep. Spontaneous reflux of bile noted into distal esophagus. Small sliding hiatal hernia. Small gastric pouch with patent B1 anastomosis. 7 mm gastric polyp snared. Recommendations:  Discontinue famotidine. Continue omeprazole at 20 mg by mouth every morning. 6 small meals daily. Sucralfate 2 g by mouth each bedtime. No aspirin for one week  Hagar Sadiq U  11/10/2011  3:33 PM  CC: Dr. Forest Gleason, MD, MD & Dr. Bonnetta Barry ref. provider found

## 2011-11-10 NOTE — H&P (Signed)
Candace [name] is an 66 y.o. female.   Chief Complaint: Patient is here for diagnostic esophagogastroduodenoscopy. HPI: Patient is 66 year old female who presents with one month history of for anxiety she has not lost any weight. She denies nausea vomiting had dysphagia abdominal pain melena or weight loss. GI history is significant for the fact that she had partial gastrectomy for large gastric leiomyoma in 1995 at [facility] in 2007 she presented with upper GI bleed secondary to gastric ulcer which was subsequently documented to have healed completely. She has been treated for H. pylori gastritis.  Past Medical History  Diagnosis Date  . Hypertension   . GERD (gastroesophageal reflux disease)   . Erosive esophagitis   . Hypertension     Past Surgical History  Procedure Date  . Abdominal hysterectomy   . Abdominal surgery   . Removal of a non cancerous gastric tumor from stomach in [place] in 1995      No family history on file. Social History:  reports that she has never smoked. She does not have any smokeless tobacco history on file. She reports that she does not drink alcohol or use illicit drugs.  Allergies:  Allergies  Allergen Reactions  . Aspirin Itching and Swelling  . Penicillins Itching and Swelling    Medications Prior to Admission  Medication Sig Dispense Refill  . docusate sodium (COLACE) 100 MG capsule Take 100 mg by mouth 2 (two) times daily.       . famotidine (PEPCID) 20 MG tablet Take 1 tablet (20 mg total) by mouth 2 (two) times daily. For 2 weeks  28 tablet  0  . hydrochlorothiazide (HYDRODIURIL) 25 MG tablet Take 25 mg by mouth every morning.       . meclizine (ANTIVERT) 25 MG tablet Take 25 mg by mouth 3 (three) times daily as needed. For dizziness      . OLANZapine (ZYPREXA) 20 MG tablet Take 20 mg by mouth at bedtime.        . potassium chloride (KLOR-CON) 10 MEQ CR tablet Take 10 mEq by mouth every morning.       . pravastatin (PRAVACHOL) 20 MG  tablet Take 20 mg by mouth at bedtime.        Marland Kitchen omeprazole (PRILOSEC) 20 MG capsule Take 20 mg by mouth every morning. For acid relfux        No results found for this or any previous visit (from the past 48 hour(s)). No results found.  ROS  Blood pressure 151/85, pulse 70, temperature 97.7 F (36.5 C), temperature source Oral, resp. rate 18, SpO2 93.00%. Physical Exam  Constitutional: She appears well-developed and well-nourished.  HENT:  Mouth/Throat: Oropharynx is clear and moist.  Eyes: Conjunctivae are normal. No scleral icterus.  Neck: No thyromegaly present.  Cardiovascular: Normal rate, regular rhythm and normal heart sounds.   No murmur heard. Respiratory: Effort normal.  GI: Soft. She exhibits no distension and no mass. There is no tenderness.  Musculoskeletal: She exhibits no edema.  Lymphadenopathy:    She has no cervical adenopathy.  Neurological: She is alert.  Skin: Skin is warm.     Assessment/Plan Early satiety. History of peptic ulcer disease as well as benign gastric tumor. Diagnostic EGD  Escher Harr U 11/10/2011, 3:05 PM

## 2011-11-10 NOTE — Discharge Instructions (Signed)
Discontinue famotidine resume usual medications as before. No aspirin for one week. Sucralfate 2 g by mouth daily at bedtime. 6 small meals daily. No driving for 24 hours. Physician will contact you with biopsy results.   Esophagogastroduodenoscopy This is an endoscopic procedure (a procedure that uses a device like a flexible telescope) that allows your caregiver to view the upper stomach and small bowel. This test allows your caregiver to look at the esophagus. The esophagus carries food from your mouth to your stomach. They can also look at your duodenum. This is the first part of the small intestine that attaches to the stomach. This test is used to detect problems in the bowel such as ulcers and inflammation. PREPARATION FOR TEST Nothing to eat after midnight the day before the test. NORMAL FINDINGS Normal esophagus, stomach, and duodenum. Ranges for normal findings may vary among different laboratories and hospitals. You should always check with your doctor after having lab work or other tests done to discuss the meaning of your test results and whether your values are considered within normal limits. MEANING OF TEST  Your caregiver will go over the test results with you and discuss the importance and meaning of your results, as well as treatment options and the need for additional tests if necessary. OBTAINING THE TEST RESULTS It is your responsibility to obtain your test results. Ask the lab or department performing the test when and how you will get your results. Document Released: 09/23/2004 Document Revised: 05/12/2011 Document Reviewed: 05/02/2008 [facility] Patient Information 2012 [place], [place].

## 2011-11-15 ENCOUNTER — Telehealth (INDEPENDENT_AMBULATORY_CARE_PROVIDER_SITE_OTHER): Payer: Self-pay | Admitting: *Deleted

## 2011-11-15 ENCOUNTER — Other Ambulatory Visit (INDEPENDENT_AMBULATORY_CARE_PROVIDER_SITE_OTHER): Payer: Self-pay | Admitting: Internal Medicine

## 2011-11-15 DIAGNOSIS — B3781 Candidal esophagitis: Secondary | ICD-10-CM

## 2011-11-15 MED ORDER — FLUCONAZOLE 100 MG PO TABS: 100.0000 mg | ORAL_TABLET | Freq: Every day | ORAL | Status: AC

## 2011-11-15 NOTE — Telephone Encounter (Signed)
Patient ask if the medication that she was given for yeast could be changed to something else, The Nystatin Liquid is making her very dizzy. She ask that we call her back after 1 pm @ [phone] She uses [employer] in [place] [phone]

## 2011-11-15 NOTE — Telephone Encounter (Signed)
Patient switched to Diflucan 100 mg by mouth daily for 10 days

## 2011-11-16 ENCOUNTER — Encounter (HOSPITAL_COMMUNITY): Payer: Self-pay | Admitting: Internal Medicine

## 2011-12-06 ENCOUNTER — Encounter (HOSPITAL_COMMUNITY): Payer: Self-pay | Admitting: *Deleted

## 2011-12-06 ENCOUNTER — Emergency Department (HOSPITAL_COMMUNITY)
Admission: EM | Admit: 2011-12-06 | Discharge: 2011-12-06 | Disposition: A | Discharge status: Home / Self Care | Payer: Medicare Other | Attending: Emergency Medicine | Admitting: Emergency Medicine

## 2011-12-06 ENCOUNTER — Emergency Department (HOSPITAL_COMMUNITY): Payer: Medicare Other

## 2011-12-06 DIAGNOSIS — Y92009 Unspecified place in unspecified non-institutional (private) residence as the place of occurrence of the external cause: Secondary | ICD-10-CM | POA: Insufficient documentation

## 2011-12-06 DIAGNOSIS — S0990XA Unspecified injury of head, initial encounter: Secondary | ICD-10-CM | POA: Insufficient documentation

## 2011-12-06 DIAGNOSIS — W108XXA Fall (on) (from) other stairs and steps, initial encounter: Secondary | ICD-10-CM | POA: Insufficient documentation

## 2011-12-06 DIAGNOSIS — T1490XA Injury, unspecified, initial encounter: Secondary | ICD-10-CM | POA: Insufficient documentation

## 2011-12-06 DIAGNOSIS — W19XXXA Unspecified fall, initial encounter: Secondary | ICD-10-CM

## 2011-12-06 NOTE — ED Provider Notes (Signed)
History  Scribed for Laray Anger, DO, the patient was seen in room APA06/AP[facility]06. This chart was scribed by Candelaria Stagers.   CSN: [id number]  Arrival date & time 12/06/11  1425   First MD Initiated Contact with Patient 12/06/11 1510      Chief Complaint  Patient presents with  . Fall     HPI Pt was seen at 1510. Candace [name] is a 66 y.o. female who presents to the Emergency Department c/o sudden onset and resolution of one episode of slip and fall on wet steps at her house approx 4 hours ago.  States she hit the back of her head on the concrete.  Denies LOC, no prodromal symptoms before fall, no focal motor weakness, no tingling/numbness in extremities, no neck or back pain, no CP/SOB, no abd pain, no N/V/D, no pain in extremities, no open wounds.    Past Medical History  Diagnosis Date  . Hypertension   . GERD (gastroesophageal reflux disease)   . Erosive esophagitis   . Hypertension     Past Surgical History  Procedure Date  . Abdominal hysterectomy   . Abdominal surgery   . 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 Esophagogastroduodenoscopy 11/10/2011    Procedure: ESOPHAGOGASTRODUODENOSCOPY (EGD);  Surgeon: Malissa Hippo, MD;  Location: [facility];  Service: Endoscopy;  Laterality: N/A;  1:00 pm    History  Substance Use Topics  . Smoking status: Never Smoker   . Smokeless tobacco: Not on file  . Alcohol Use: No    Review of Systems ROS: Statement: All systems negative except as marked or noted in the HPI; Constitutional: Negative for fever and chills. ; ; Eyes: Negative for eye pain, redness and discharge. ; ; ENMT: Negative for ear pain, hoarseness, nasal congestion, sinus pressure and sore throat. ; ; Cardiovascular: Negative for chest pain, palpitations, diaphoresis, dyspnea and peripheral edema. ; ; Respiratory: Negative for cough, wheezing and stridor. ; ; Gastrointestinal: Negative for nausea, vomiting,  diarrhea, abdominal pain, blood in stool, hematemesis, jaundice and rectal bleeding. . ; ; Genitourinary: Negative for dysuria, flank pain and hematuria. ; ; Musculoskeletal: +fall, head injury. Negative for back pain and neck pain. Negative for swelling and trauma.; ; Skin: Negative for pruritus, rash, abrasions, blisters, bruising and skin lesion.; ; Neuro: Negative for headache, lightheadedness and neck stiffness. Negative for weakness, altered level of consciousness , altered mental status, extremity weakness, paresthesias, involuntary movement, seizure and syncope.     Allergies  Aspirin and Penicillins  Home Medications   Current Outpatient Rx  Name Route Sig Dispense Refill  . DOCUSATE SODIUM 100 MG PO CAPS Oral Take 100 mg by mouth 2 (two) times daily.     Marland Kitchen HYDROCHLOROTHIAZIDE 25 MG PO TABS Oral Take 25 mg by mouth every morning.     Marland Kitchen MECLIZINE HCL 25 MG PO TABS Oral Take 25 mg by mouth 3 (three) times daily as needed. For dizziness    . OLANZAPINE 20 MG PO TABS Oral Take 20 mg by mouth at bedtime.      . OMEPRAZOLE 20 MG PO CPDR Oral Take 20 mg by mouth every morning. For acid relfux    . POTASSIUM CHLORIDE 10 MEQ PO TBCR Oral Take 10 mEq by mouth every morning.     Marland Kitchen PRAVASTATIN SODIUM 20 MG PO TABS Oral Take 20 mg by mouth at bedtime.      Marland Kitchen  SUCRALFATE 1 GM/10ML PO SUSP Oral Take 20 mLs (2 g total) by mouth at bedtime. 240 mL 1    BP 122/77  Pulse 87  Temp 98.1 F (36.7 C) (Oral)  Resp 18  Ht 5\' 4"  (1.626 m)  Wt 132 lb (59.875 kg)  BMI 22.66 kg/m2  SpO2 100%  Physical Exam 1515: Physical examination: Vital signs and O2 SAT: Reviewed; Constitutional: Well developed, Well nourished, Well hydrated, In no acute distress; Head and Face: Normocephalic, Atraumatic, no open wounds, no hematoma; Eyes: EOMI, PERRL, No scleral icterus; ENMT: Mouth and pharynx normal, Left TM normal, Right TM normal, Mucous membranes moist; Neck: Supple, Trachea midline; Spine: No midline CS, TS, LS  tenderness, no abrasions or ecchymosis.; Cardiovascular: Regular rate and rhythm, No murmur, or gallop; Respiratory: Breath sounds clear & equal bilaterally, No wheezes, speaking full sentences with ease, Normal respiratory effort/excursion; Chest: Nontender, No deformity, Movement normal.; Abdomen: Soft, Nontender, Nondistended, Normal bowel sounds.; Genitourinary: No CVA tenderness;; Extremities: No deformity, Full range of motion major/large joints of bilat UE's and LE's without pain or tenderness to palp, Neurovascularly intact, Pulses normal, No tenderness, No edema, Pelvis stable; Neuro: AA&Ox3, GCS 15.  Major CN grossly intact. Speech clear. No gross focal motor or sensory deficits in extremities.; Skin: Color normal, Warm, Dry   ED Course  Procedures   MDM  MDM Reviewed: nursing note and vitals Interpretation: CT scan    Ct Head Wo Contrast 12/06/2011  *RADIOLOGY REPORT*  Clinical Data: Fall.  CT HEAD WITHOUT CONTRAST  Technique:  Contiguous axial images were obtained from the base of the skull through the vertex without contrast.  Comparison: 02/03/2010  Findings:  There is no intra or extra-axial fluid collection or mass lesion.  The basilar cisterns and ventricles have a normal appearance.  There is no CT evidence for acute infarction or hemorrhage.  Bone windows show no calvarial fracture.  Paranasal sinuses are well-aerated.  There is opacity within scattered bilateral mastoid air cells, possibly chronic.  IMPRESSION:  1. No evidence for acute intracranial abnormality. 2.  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  Original Report Authenticated By: Patterson Hammersmith, M.D.     4:33 PM:   No change in neuro status, VSS.  States she feels "ok" and wants to go home now.  Has climbed off of the stretcher by herself and gotten herself dressed.  Dx testing d/w pt.  Questions answered.  Verb understanding, agreeable to d/c home with outpt f/u.          I personally performed the  services described in this documentation, which was scribed in my presence. The recorded information has been reviewed and considered. Shiquan Mathieu Allison Quarry, DO 12/07/11 1841

## 2011-12-06 NOTE — ED Notes (Signed)
Pt states that she slipped on her wet steps and slid down 4- 5 steps and hit her head on the concrete. Denies loss of consciousness or nausea.

## 2012-01-16 DIAGNOSIS — H2510 Age-related nuclear cataract, unspecified eye: Secondary | ICD-10-CM | POA: Insufficient documentation

## 2012-03-31 ENCOUNTER — Encounter (HOSPITAL_COMMUNITY): Payer: Self-pay | Admitting: *Deleted

## 2012-03-31 ENCOUNTER — Emergency Department (HOSPITAL_COMMUNITY)
Admission: EM | Admit: 2012-03-31 | Discharge: 2012-03-31 | Disposition: A | Discharge status: Home / Self Care | Payer: Medicare Other | Attending: Emergency Medicine | Admitting: Emergency Medicine

## 2012-03-31 DIAGNOSIS — Z79899 Other long term (current) drug therapy: Secondary | ICD-10-CM | POA: Insufficient documentation

## 2012-03-31 DIAGNOSIS — S0990XA Unspecified injury of head, initial encounter: Secondary | ICD-10-CM | POA: Insufficient documentation

## 2012-03-31 DIAGNOSIS — W1809XA Striking against other object with subsequent fall, initial encounter: Secondary | ICD-10-CM | POA: Insufficient documentation

## 2012-03-31 DIAGNOSIS — I10 Essential (primary) hypertension: Secondary | ICD-10-CM | POA: Insufficient documentation

## 2012-03-31 DIAGNOSIS — Y929 Unspecified place or not applicable: Secondary | ICD-10-CM | POA: Insufficient documentation

## 2012-03-31 DIAGNOSIS — Y9389 Activity, other specified: Secondary | ICD-10-CM | POA: Insufficient documentation

## 2012-03-31 DIAGNOSIS — K209 Esophagitis, unspecified without bleeding: Secondary | ICD-10-CM | POA: Insufficient documentation

## 2012-03-31 DIAGNOSIS — K219 Gastro-esophageal reflux disease without esophagitis: Secondary | ICD-10-CM | POA: Insufficient documentation

## 2012-03-31 NOTE — ED Provider Notes (Signed)
History     CSN: [id number]  Arrival date & time 03/31/12  1439   First MD Initiated Contact with Patient 03/31/12 1455      Chief Complaint  Patient presents with  . Head Injury    (Consider location/radiation/quality/duration/timing/severity/associated sxs/prior treatment) HPI Comments: Pt was taking groceries out her car trunk and the lid fell on her head.  No LOC.  "my head hurt for a while but it doesn't hurt now".  No pain meds taken.  No change in coordination or behavior.  Not taking any anticoagulants.  "thought i better come and see if i had a concussion or something".  Patient is a 66 y.o. female presenting with head injury. The history is provided by the patient. No language interpreter was used.  Head Injury  The incident occurred 3 to 5 hours ago. She came to the ER via walk-in. The injury mechanism was a direct blow. There was no loss of consciousness. There was no blood loss. The pain is at a severity of 0/10. Pertinent negatives include no numbness, no blurred vision, no vomiting, no tinnitus, patient does not experience disorientation, no weakness and no memory loss. She has tried nothing for the symptoms.    Past Medical History  Diagnosis Date  . Hypertension   . GERD (gastroesophageal reflux disease)   . Erosive esophagitis   . Hypertension     Past Surgical History  Procedure Date  . Abdominal hysterectomy   . Abdominal surgery   . 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 Esophagogastroduodenoscopy 11/10/2011    Procedure: ESOPHAGOGASTRODUODENOSCOPY (EGD);  Surgeon: Malissa Hippo, MD;  Location: [facility];  Service: Endoscopy;  Laterality: N/A;  1:00 pm    No family history on file.  History  Substance Use Topics  . Smoking status: Never Smoker   . Smokeless tobacco: Not on file  . Alcohol Use: No    OB History    Grav Para Term Preterm Abortions TAB SAB Ect Mult Living                  Review of  Systems  HENT: Negative for neck pain and tinnitus.   Eyes: Negative for blurred vision and visual disturbance.  Gastrointestinal: Negative for nausea and vomiting.  Skin: Negative for wound.  Neurological: Negative for dizziness, syncope, weakness, light-headedness, numbness and headaches.  Hematological: Does not bruise/bleed easily.  Psychiatric/Behavioral: Negative for memory loss, behavioral problems, confusion and decreased concentration.  All other systems reviewed and are negative.    Allergies  Aspirin and Penicillins  Home Medications   Current Outpatient Rx  Name Route Sig Dispense Refill  . DOCUSATE SODIUM 100 MG PO CAPS Oral Take 100 mg by mouth 2 (two) times daily.     Marland Kitchen HYDROCHLOROTHIAZIDE 25 MG PO TABS Oral Take 25 mg by mouth every morning.     Marland Kitchen MECLIZINE HCL 25 MG PO TABS Oral Take 25 mg by mouth 3 (three) times daily as needed. For dizziness    . OLANZAPINE 20 MG PO TABS Oral Take 20 mg by mouth at bedtime.      . OMEPRAZOLE 20 MG PO CPDR Oral Take 20 mg by mouth every morning. For acid relfux    . POTASSIUM CHLORIDE 10 MEQ PO TBCR Oral Take 10 mEq by mouth every morning.     Marland Kitchen PRAVASTATIN SODIUM 20 MG PO TABS Oral Take 20 mg by mouth  at bedtime.      . SUCRALFATE 1 GM/10ML PO SUSP Oral Take 20 mLs (2 g total) by mouth at bedtime. 240 mL 1    BP 125/83  Pulse 76  Temp 97.8 F (36.6 C) (Oral)  Resp 16  Ht 5\' 4"  (1.626 m)  Wt 140 lb (63.504 kg)  BMI 24.03 kg/m2  SpO2 100%  Physical Exam  Nursing note and vitals reviewed. Constitutional: She is oriented to person, place, and time. She appears well-developed and well-nourished. No distress.  HENT:  Head: Normocephalic and atraumatic. Head is without raccoon's eyes, without Battle's sign and without contusion.    Right Ear: External ear normal.  Left Ear: External ear normal.  Eyes: EOM are normal. Pupils are equal, round, and reactive to light.  Neck: Trachea normal and normal range of motion. No  spinous process tenderness and no muscular tenderness present. Normal range of motion present.  Cardiovascular: Normal rate and regular rhythm.   Pulmonary/Chest: Effort normal.  Abdominal: Soft. She exhibits no distension. There is no tenderness.  Musculoskeletal: Normal range of motion. She exhibits no tenderness.  Neurological: She is alert and oriented to person, place, and time. No cranial nerve deficit. Coordination normal.  Skin: Skin is warm and dry.  Psychiatric: She has a normal mood and affect. Her behavior is normal. Judgment and thought content normal.    ED Course  Procedures (including critical care time)  Labs Reviewed - No data to display No results found.   1. Head injury       MDM  No focal neuro sxs.  No obvious injury.  Pt reassured. Told to return  If change in LOC, behavior or coordination.        Evalina Field, [place] 03/31/12 1521

## 2012-03-31 NOTE — ED Notes (Signed)
Trunk of car fell and hit her on top of frontal area.  No bruising, swelling or laceration noted. No c/o headache or neck pain.

## 2012-03-31 NOTE — ED Notes (Signed)
Patient with no complaints at this time. Respirations even and unlabored. Skin warm/dry. Discharge instructions reviewed with patient at this time. Patient given opportunity to voice concerns/ask questions. Patient discharged at this time and left Emergency Department with steady gait.   

## 2012-03-31 NOTE — ED Notes (Signed)
Reports was getting groceries out of car when trunk of car fell onto pt's head.  Denies loc.  C/o pain at that time, but denies pain at this time.

## 2012-04-01 NOTE — ED Provider Notes (Signed)
Medical screening examination/treatment/procedure(s) were performed by non-physician practitioner and as supervising physician I was immediately available for consultation/collaboration.  Geoffery Lyons, MD 04/01/12 [phone]

## 2013-10-17 ENCOUNTER — Telehealth (INDEPENDENT_AMBULATORY_CARE_PROVIDER_SITE_OTHER): Payer: Self-pay | Admitting: *Deleted

## 2013-10-17 NOTE — Telephone Encounter (Signed)
Last TCS was 2007, when is patient due for repeat TCS

## 2013-10-17 NOTE — Telephone Encounter (Signed)
Next colonoscopy would be in 2017 unless family history is changes or if she is having  Symptoms. Please let patient know

## 2013-10-21 NOTE — Telephone Encounter (Signed)
Patient aware.

## 2015-08-09 DIAGNOSIS — E785 Hyperlipidemia, unspecified: Secondary | ICD-10-CM | POA: Insufficient documentation

## 2015-12-31 DIAGNOSIS — L900 Lichen sclerosus et atrophicus: Secondary | ICD-10-CM | POA: Insufficient documentation

## 2016-02-22 ENCOUNTER — Encounter (INDEPENDENT_AMBULATORY_CARE_PROVIDER_SITE_OTHER): Payer: Self-pay | Admitting: *Deleted

## 2016-03-09 ENCOUNTER — Other Ambulatory Visit (INDEPENDENT_AMBULATORY_CARE_PROVIDER_SITE_OTHER): Payer: Self-pay | Admitting: *Deleted

## 2016-03-09 DIAGNOSIS — Z1211 Encounter for screening for malignant neoplasm of colon: Secondary | ICD-10-CM

## 2016-05-11 ENCOUNTER — Encounter (INDEPENDENT_AMBULATORY_CARE_PROVIDER_SITE_OTHER): Payer: Self-pay | Admitting: *Deleted

## 2016-05-11 ENCOUNTER — Telehealth (INDEPENDENT_AMBULATORY_CARE_PROVIDER_SITE_OTHER): Payer: Self-pay | Admitting: *Deleted

## 2016-05-11 MED ORDER — PEG 3350-KCL-NA BICARB-NACL 420 G PO SOLR: 4000.0000 mL | Freq: Once | ORAL | 0 refills | Status: AC

## 2016-05-11 NOTE — Telephone Encounter (Signed)
Patient needs trilyte 

## 2016-05-16 ENCOUNTER — Telehealth (INDEPENDENT_AMBULATORY_CARE_PROVIDER_SITE_OTHER): Payer: Self-pay | Admitting: *Deleted

## 2016-05-16 NOTE — Telephone Encounter (Signed)
Referring MD/PCP: [facility]   Procedure: tcs  Reason/Indication:  screening  Has patient had this procedure before?  Yes, 10 yrs ago  If so, when, by whom and where?    Is there a family history of colon cancer?  no  Who?  What age when diagnosed?    Is patient diabetic?   no      Does patient have prosthetic heart valve or mechanical valve?  no  Do you have a pacemaker?  no  Has patient ever had endocarditis? no  Has patient had joint replacement within last 12 months?  no  Does patient tend to be constipated or take laxatives? some  Does patient have a history of alcohol/drug use?  no  Is patient on Coumadin, Plavix and/or Aspirin? no  Medications: see epic  Allergies: see epic  Medication Adjustment:   Procedure date & time: 06/16/16 at 1030

## 2016-05-17 NOTE — Telephone Encounter (Signed)
agree

## 2016-06-15 ENCOUNTER — Encounter (HOSPITAL_COMMUNITY): Payer: Self-pay | Admitting: *Deleted

## 2016-06-16 ENCOUNTER — Encounter (HOSPITAL_COMMUNITY)
Admission: RE | Disposition: A | Discharge status: Home / Self Care | Payer: Self-pay | Source: Ambulatory Visit | Attending: Internal Medicine

## 2016-06-16 ENCOUNTER — Ambulatory Visit (HOSPITAL_COMMUNITY)
Admission: RE | Admit: 2016-06-16 | Discharge: 2016-06-16 | Disposition: A | Discharge status: Home / Self Care | Payer: Medicare HMO | Source: Ambulatory Visit | Attending: Internal Medicine | Admitting: Internal Medicine

## 2016-06-16 ENCOUNTER — Encounter (HOSPITAL_COMMUNITY): Payer: Self-pay | Admitting: *Deleted

## 2016-06-16 DIAGNOSIS — K648 Other hemorrhoids: Secondary | ICD-10-CM | POA: Insufficient documentation

## 2016-06-16 DIAGNOSIS — Z880 Allergy status to penicillin: Secondary | ICD-10-CM | POA: Diagnosis not present

## 2016-06-16 DIAGNOSIS — Z79899 Other long term (current) drug therapy: Secondary | ICD-10-CM | POA: Insufficient documentation

## 2016-06-16 DIAGNOSIS — Z1211 Encounter for screening for malignant neoplasm of colon: Secondary | ICD-10-CM | POA: Insufficient documentation

## 2016-06-16 DIAGNOSIS — K644 Residual hemorrhoidal skin tags: Secondary | ICD-10-CM | POA: Diagnosis not present

## 2016-06-16 DIAGNOSIS — I10 Essential (primary) hypertension: Secondary | ICD-10-CM | POA: Diagnosis not present

## 2016-06-16 DIAGNOSIS — K219 Gastro-esophageal reflux disease without esophagitis: Secondary | ICD-10-CM | POA: Diagnosis not present

## 2016-06-16 DIAGNOSIS — K5730 Diverticulosis of large intestine without perforation or abscess without bleeding: Secondary | ICD-10-CM | POA: Insufficient documentation

## 2016-06-16 HISTORY — PX: COLONOSCOPY: SHX5424

## 2016-06-16 SURGERY — COLONOSCOPY
Anesthesia: Moderate Sedation

## 2016-06-16 MED ORDER — STERILE WATER FOR IRRIGATION IR SOLN
Status: DC | PRN
  Administered 2016-06-16: 10:00:00

## 2016-06-16 MED ORDER — SODIUM CHLORIDE 0.9 % IV SOLN
INTRAVENOUS | Status: DC
  Administered 2016-06-16: 1000 mL via INTRAVENOUS

## 2016-06-16 MED ORDER — MEPERIDINE HCL 50 MG/ML IJ SOLN
INTRAMUSCULAR | Status: AC
  Filled 2016-06-16: qty 1

## 2016-06-16 MED ORDER — MEPERIDINE HCL 50 MG/ML IJ SOLN
INTRAMUSCULAR | Status: DC | PRN
  Administered 2016-06-16 (×2): 25 mg via INTRAVENOUS

## 2016-06-16 MED ORDER — MIDAZOLAM HCL 5 MG/5ML IJ SOLN
INTRAMUSCULAR | Status: AC
  Filled 2016-06-16: qty 10

## 2016-06-16 MED ORDER — MIDAZOLAM HCL 5 MG/5ML IJ SOLN
INTRAMUSCULAR | Status: DC | PRN
  Administered 2016-06-16: 2 mg via INTRAVENOUS
  Administered 2016-06-16: 1 mg via INTRAVENOUS
  Administered 2016-06-16: 2 mg via INTRAVENOUS

## 2016-06-16 NOTE — Op Note (Signed)
[facility] Patient Name: Candace [name] Procedure Date: 06/16/2016 10:04 AM MRN: [id number] Date of Birth: 05-30-1946 Attending MD: Hildred Laser , MD CSN: [id number] Age: 71 Admit Type: Outpatient Procedure:                Colonoscopy Indications:              Screening for colorectal malignant neoplasm Providers:                Hildred Laser, MD, Otis Peak B. Sharon Seller, RN, Isabella Stalling, Technician Referring MD:             Isac Caddy, FNP-C Medicines:                Meperidine 50 mg IV, Midazolam 5 mg IV Complications:            No immediate complications. Estimated Blood Loss:     Estimated blood loss: none. Procedure:                Pre-Anesthesia Assessment:                           - Prior to the procedure, a History and Physical                            was performed, and patient medications and                            allergies were reviewed. The patient's tolerance of                            previous anesthesia was also reviewed. The risks                            and benefits of the procedure and the sedation                            options and risks were discussed with the patient.                            All questions were answered, and informed consent                            was obtained. Prior Anticoagulants: The patient has                            taken no previous anticoagulant or antiplatelet                            agents. ASA Grade Assessment: II - A patient with                            mild systemic disease. After reviewing the risks  and benefits, the patient was deemed in                            satisfactory condition to undergo the procedure.                           After obtaining informed consent, the colonoscope                            was passed under direct vision. Throughout the                            procedure, the patient's blood pressure, pulse, and                             oxygen saturations were monitored continuously. The                            [id number]) scope was introduced through                            the anus and advanced to the the cecum, identified                            by appendiceal orifice and ileocecal valve. The                            colonoscopy was somewhat difficult due to                            restricted mobility of the colon. Successful                            completion of the procedure was aided by changing                            the patient's position and using manual pressure.                            The patient tolerated the procedure well. The                            quality of the bowel preparation was adequate to                            identify polyps. The ileocecal valve, appendiceal                            orifice, and rectum were photographed. Scope In: 10:19:05 AM Scope Out: 10:42:45 AM Scope Withdrawal Time: 0 hours 7 minutes 54 seconds  Total Procedure Duration: 0 hours 23 minutes 40 seconds  Findings:      The perianal and digital rectal examinations were normal.      Scattered medium-mouthed diverticula were found in the  descending colon,       splenic flexure, transverse colon, ascending colon and cecum.      Multiple small and large-mouthed diverticula were found in the sigmoid       colon.      Internal hemorrhoids were found during retroflexion. The hemorrhoids       were small. Impression:               - Diverticulosis in the descending colon, at the                            splenic flexure, in the transverse colon, in the                            ascending colon and in the cecum.                           - Diverticulosis in the sigmoid colon.                           - Internal hemorrhoids.                           - No specimens collected. Moderate Sedation:      Moderate (conscious) sedation was administered by the endoscopy  nurse       and supervised by the endoscopist. The following parameters were       monitored: oxygen saturation, heart rate, blood pressure, CO2       capnography and response to care. Total physician intraservice time was       28 minutes. Recommendation:           - Patient has a contact number available for                            emergencies. The signs and symptoms of potential                            delayed complications were discussed with the                            patient. Return to normal activities tomorrow.                            Written discharge instructions were provided to the                            patient.                           - High fiber diet today.                           - Continue present medications.                           - Repeat colonoscopy in 10 years for screening  purposes. Procedure Code(s):        --- Professional ---                           [id number], Colonoscopy, flexible; diagnostic, including                            collection of specimen(s) by brushing or washing,                            when performed (separate procedure)                           99152, Moderate sedation services provided by the                            same physician or other qualified health care                            professional performing the diagnostic or                            therapeutic service that the sedation supports,                            requiring the presence of an independent trained                            observer to assist in the monitoring of the                            patient's level of consciousness and physiological                            status; initial 15 minutes of intraservice time,                            patient age 78 years or older                           [phone], Moderate sedation services; each additional                            15 minutes intraservice  time Diagnosis Code(s):        --- Professional ---                           Z12.11, Encounter for screening for malignant                            neoplasm of colon                           K64.4, Residual hemorrhoidal skin tags  K57.30, Diverticulosis of large intestine without                            perforation or abscess without bleeding CPT copyright 2016 American Medical Association. All rights reserved. The codes documented in this report are preliminary and upon coder review may  be revised to meet current compliance requirements. Hildred Laser, MD Hildred Laser, MD 06/16/2016 10:52:45 AM This report has been signed electronically. Number of Addenda: 0

## 2016-06-16 NOTE — H&P (Signed)
Candace [name] is an 71 y.o. female.   Chief Complaint: Patient is here for colonoscopy. HPI: Patient is 71 year old African-American female was a for screening colonoscopy. Last exam was 10 years ago. She denies abdominal pain change in bowel habits or rectal bleeding. Family history is negative for CRC.  Past Medical History:  Diagnosis Date  . Erosive esophagitis   . GERD (gastroesophageal reflux disease)   . Hypertension   . Hypertension     Past Surgical History:  Procedure Laterality Date  . ABDOMINAL HYSTERECTOMY    . ABDOMINAL SURGERY    . ESOPHAGOGASTRODUODENOSCOPY  11/10/2011   Procedure: ESOPHAGOGASTRODUODENOSCOPY (EGD);  Surgeon: Rogene Houston, MD;  Location: [facility];  Service: Endoscopy;  Laterality: N/A;  1:00 pm  . 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 No pertinent family history. Social History:  reports that she has never smoked. She has quit using smokeless tobacco. She reports that she does not drink alcohol or use drugs.  Allergies:  Allergies  Allergen Reactions  . Aspirin Itching and Swelling  . Penicillins Hives, Itching and Swelling    Has patient had a PCN reaction causing immediate rash, facial/tongue/throat swelling, SOB or lightheadedness with hypotension: Yes Has patient had a PCN reaction causing severe rash involving mucus membranes or skin necrosis: Yes Has patient had a PCN reaction that required hospitalization No Has patient had a PCN reaction occurring within the last 10 years: No If all of the above answers are "NO", then may proceed with Cephalosporin use.     Medications Prior to Admission  Medication Sig Dispense Refill  . famotidine (PEPCID) 20 MG tablet Take 20 mg by mouth daily.    . hydrochlorothiazide (MICROZIDE) 12.5 MG capsule Take 12.5 mg by mouth daily.    Marland Kitchen OLANZapine (ZYPREXA) 5 MG tablet Take 5 mg by mouth at bedtime.    . potassium chloride (KLOR-CON) 20  MEQ packet Take 20 mEq by mouth daily.    . pravastatin (PRAVACHOL) 20 MG tablet Take 20 mg by mouth at bedtime.      . docusate sodium (COLACE) 100 MG capsule Take 100 mg by mouth daily as needed for mild constipation.     . meclizine (ANTIVERT) 25 MG tablet Take 25 mg by mouth 3 (three) times daily as needed. For dizziness      No results found for this or any previous visit (from the past 48 hour(s)). No results found.  ROS  Blood pressure 138/89, pulse 90, temperature 98 F (36.7 C), temperature source Oral, resp. rate 19, height 5\' 4"  (1.626 m), weight 152 lb (68.9 kg), SpO2 100 %. Physical Exam  Constitutional: She appears well-developed and well-nourished.  HENT:  Mouth/Throat: Oropharynx is clear and moist.  Eyes: Conjunctivae are normal. No scleral icterus.  Neck: No thyromegaly present.  Cardiovascular: Normal rate, regular rhythm and normal heart sounds.   No murmur heard. Respiratory: Effort normal and breath sounds normal.  GI:  Abdomen is symmetrical with long scar across upper abdomen with reverse keep t pattern. She also has lower midline scar. Abdomen is soft and nontender without organomegaly or masses.  Musculoskeletal: She exhibits no edema.  Lymphadenopathy:    She has no cervical adenopathy.  Neurological: She is alert.  Skin: Skin is warm and dry.     Assessment/Plan Average risk screening colonoscopy.  Hildred Laser, MD 06/16/2016, 10:10 AM

## 2016-06-16 NOTE — Discharge Instructions (Signed)
Resume usual medications and high fiber diet. No driving for 24 hours. Next screening exam in 10 years.   Colonoscopy, Adult, Care After This sheet gives you information about how to care for yourself after your procedure. Your health care provider may also give you more specific instructions. If you have problems or questions, contact your health care provider. What can I expect after the procedure? After the procedure, it is common to have:  A small amount of blood in your stool for 24 hours after the procedure.  Some gas.  Mild abdominal cramping or bloating. Follow these instructions at home: General instructions  For the first 24 hours after the procedure:  Do not drive or use machinery.  Do not sign important documents.  Do not drink alcohol.  Do your regular daily activities at a slower pace than normal.  Eat soft, easy-to-digest foods.  Rest often.  Take over-the-counter or prescription medicines only as told by your health care provider.  It is up to you to get the results of your procedure. Ask your health care provider, or the department performing the procedure, when your results will be ready. Relieving cramping and bloating  Try walking around when you have cramps or feel bloated.  Apply heat to your abdomen as told by your health care provider. Use a heat source that your health care provider recommends, such as a moist heat pack or a heating pad.  Place a towel between your skin and the heat source.  Leave the heat on for 20-30 minutes.  Remove the heat if your skin turns bright red. This is especially important if you are unable to feel pain, heat, or cold. You may have a greater risk of getting burned. Eating and drinking  Drink enough fluid to keep your urine clear or pale yellow.  Resume your normal diet as instructed by your health care provider. Avoid heavy or fried foods that are hard to digest.  Avoid drinking alcohol for as long as  instructed by your health care provider. Contact a health care provider if:  You have blood in your stool 2-3 days after the procedure. Get help right away if:  You have more than a small spotting of blood in your stool.  You pass large blood clots in your stool.  Your abdomen is swollen.  You have nausea or vomiting.  You have a fever.  You have increasing abdominal pain that is not relieved with medicine. This information is not intended to replace advice given to you by your health care provider. Make sure you discuss any questions you have with your health care provider. Document Released: 01/05/2004 Document Revised: 02/15/2016 Document Reviewed: 08/04/2015 Elsevier Interactive Patient Education  2017 [employer]   Diverticulosis Diverticulosis is the condition that develops when small pouches (diverticula) form in the wall of your colon. Your colon, or large intestine, is where water is absorbed and stool is formed. The pouches form when the inside layer of your colon pushes through weak spots in the outer layers of your colon. CAUSES  No one knows exactly what causes diverticulosis. RISK FACTORS  Being older than 37. Your risk for this condition increases with age. Diverticulosis is rare in people younger than 40 years. By age 27, almost everyone has it.  Eating a low-fiber diet.  Being frequently constipated.  Being overweight.  Not getting enough exercise.  Smoking.  Taking over-the-counter pain medicines, like aspirin and ibuprofen. SYMPTOMS  Most people with diverticulosis do not have  symptoms. DIAGNOSIS  Because diverticulosis often has no symptoms, health care providers often discover the condition during an exam for other colon problems. In many cases, a health care provider will diagnose diverticulosis while using a flexible scope to examine the colon (colonoscopy). TREATMENT  If you have never developed an infection related to diverticulosis, you may  not need treatment. If you have had an infection before, treatment may include:  Eating more fruits, vegetables, and grains.  Taking a fiber supplement.  Taking a live bacteria supplement (probiotic).  Taking medicine to relax your colon. HOME CARE INSTRUCTIONS   Drink at least 6-8 glasses of water each day to prevent constipation.  Try not to strain when you have a bowel movement.  Keep all follow-up appointments. If you have had an infection before:  Increase the fiber in your diet as directed by your health care provider or dietitian.  Take a dietary fiber supplement if your health care provider approves.  Only take medicines as directed by your health care provider. SEEK MEDICAL CARE IF:   You have abdominal pain.  You have bloating.  You have cramps.  You have not gone to the bathroom in 3 days. SEEK IMMEDIATE MEDICAL CARE IF:   Your pain gets worse.  Yourbloating becomes very bad.  You have a fever or chills, and your symptoms suddenly get worse.  You begin vomiting.  You have bowel movements that are bloody or black. MAKE SURE YOU:  Understand these instructions.  Will watch your condition.  Will get help right away if you are not doing well or get worse. This information is not intended to replace advice given to you by your health care provider. Make sure you discuss any questions you have with your health care provider. Document Released: 02/18/2004 Document Revised: 05/28/2013 Document Reviewed: 04/17/2013 Elsevier Interactive Patient Education  2017 [place].    High-Fiber Diet Fiber, also called dietary fiber, is a type of carbohydrate found in fruits, vegetables, whole grains, and beans. A high-fiber diet can have many health benefits. Your health care provider may recommend a high-fiber diet to help:  Prevent constipation. Fiber can make your bowel movements more regular.  Lower your cholesterol.  Relieve hemorrhoids, uncomplicated  diverticulosis, or irritable bowel syndrome.  Prevent overeating as part of a weight-loss plan.  Prevent heart disease, type 2 diabetes, and certain cancers. What is my plan? The recommended daily intake of fiber includes:  38 grams for men under age 13.  29 grams for men over age 13.  19 grams for women under age 52.  13 grams for women over age 60. You can get the recommended daily intake of dietary fiber by eating a variety of fruits, vegetables, grains, and beans. Your health care provider may also recommend a fiber supplement if it is not possible to get enough fiber through your diet. What do I need to know about a high-fiber diet?  Fiber supplements have not been widely studied for their effectiveness, so it is better to get fiber through food sources.  Always check the fiber content on thenutrition facts label of any prepackaged food. Look for foods that contain at least 5 grams of fiber per serving.  Ask your dietitian if you have questions about specific foods that are related to your condition, especially if those foods are not listed in the following section.  Increase your daily fiber consumption gradually. Increasing your intake of dietary fiber too quickly may cause bloating, cramping, or gas.  Drink plenty of water. Water helps you to digest fiber. What foods can I eat? Grains  Whole-grain breads. Multigrain cereal. Oats and oatmeal. Brown rice. Barley. Bulgur wheat. [place]. Bran muffins. Popcorn. Rye wafer crackers. Vegetables  Sweet potatoes. Spinach. Kale. Artichokes. Cabbage. Broccoli. Green peas. Carrots. Squash. Fruits  Berries. Pears. Apples. Oranges. Avocados. Prunes and raisins. Dried figs. Meats and Other Protein Sources  [place], kidney, pinto, and soy beans. Split peas. Lentils. Nuts and seeds. Dairy  Fiber-fortified yogurt. Beverages  Fiber-fortified soy milk. Fiber-fortified orange juice. Other  Fiber bars. The items listed above may not be a  complete list of recommended foods or beverages. Contact your dietitian for more options.  What foods are not recommended? Grains  White bread. Pasta made with refined flour. White rice. Vegetables  Fried potatoes. Canned vegetables. Well-cooked vegetables. Fruits  Fruit juice. Cooked, strained fruit. Meats and Other Protein Sources  Fatty cuts of meat. Fried Sales executive or fried fish. Dairy  Milk. Yogurt. Cream cheese. Sour cream. Beverages  Soft drinks. Other  Cakes and pastries. Butter and oils. The items listed above may not be a complete list of foods and beverages to avoid. Contact your dietitian for more information.  What are some tips for including high-fiber foods in my diet?  Eat a wide variety of high-fiber foods.  Make sure that half of all grains consumed each day are whole grains.  Replace breads and cereals made from refined flour or white flour with whole-grain breads and cereals.  Replace white rice with brown rice, bulgur wheat, or millet.  Start the day with a breakfast that is high in fiber, such as a cereal that contains at least 5 grams of fiber per serving.  Use beans in place of meat in soups, salads, or pasta.  Eat high-fiber snacks, such as berries, raw vegetables, nuts, or popcorn. This information is not intended to replace advice given to you by your health care provider. Make sure you discuss any questions you have with your health care provider. Document Released: 05/23/2005 Document Revised: 10/29/2015 Document Reviewed: 11/05/2013 Elsevier Interactive Patient Education  2017 [employer].

## 2016-06-17 ENCOUNTER — Encounter (HOSPITAL_COMMUNITY): Payer: Self-pay | Admitting: Internal Medicine

## 2016-07-05 ENCOUNTER — Encounter: Payer: Self-pay | Admitting: Internal Medicine

## 2017-03-22 ENCOUNTER — Encounter (HOSPITAL_COMMUNITY): Payer: Self-pay | Admitting: *Deleted

## 2017-03-22 DIAGNOSIS — N390 Urinary tract infection, site not specified: Secondary | ICD-10-CM | POA: Insufficient documentation

## 2017-03-22 DIAGNOSIS — R109 Unspecified abdominal pain: Secondary | ICD-10-CM | POA: Diagnosis present

## 2017-03-22 DIAGNOSIS — Z79899 Other long term (current) drug therapy: Secondary | ICD-10-CM | POA: Diagnosis not present

## 2017-03-22 DIAGNOSIS — I10 Essential (primary) hypertension: Secondary | ICD-10-CM | POA: Insufficient documentation

## 2017-03-22 LAB — URINALYSIS, ROUTINE W REFLEX MICROSCOPIC
BILIRUBIN URINE: NEGATIVE
Glucose, UA: NEGATIVE mg/dL
Ketones, ur: 5 mg/dL — AB
Nitrite: POSITIVE — AB
PH: 5 (ref 5.0–8.0)
Protein, ur: NEGATIVE mg/dL
SPECIFIC GRAVITY, URINE: 1.009 (ref 1.005–1.030)

## 2017-03-22 NOTE — ED Triage Notes (Signed)
Pt c/o urinary frequency and left flank pain with chills that started yesterday,

## 2017-03-23 ENCOUNTER — Emergency Department (HOSPITAL_COMMUNITY)
Admission: EM | Admit: 2017-03-23 | Discharge: 2017-03-23 | Disposition: A | Discharge status: Home / Self Care | Payer: Medicare HMO | Attending: Emergency Medicine | Admitting: Emergency Medicine

## 2017-03-23 DIAGNOSIS — N390 Urinary tract infection, site not specified: Secondary | ICD-10-CM

## 2017-03-23 DIAGNOSIS — R109 Unspecified abdominal pain: Secondary | ICD-10-CM

## 2017-03-23 MED ORDER — NITROFURANTOIN MONOHYD MACRO 100 MG PO CAPS: 100.0000 mg | ORAL_CAPSULE | Freq: Two times a day (BID) | ORAL | 0 refills | Status: DC

## 2017-03-23 MED ORDER — NITROFURANTOIN MACROCRYSTAL 50 MG PO CAPS
100.0000 mg | ORAL_CAPSULE | Freq: Once | ORAL | Status: AC
  Administered 2017-03-23: 100 mg via ORAL
  Filled 2017-03-23 (×2): qty 2

## 2017-03-23 NOTE — Discharge Instructions (Signed)
Return if you start running a fever, start vomiting, or are not showing any improvement over the next 2-3 days.  Take acetaminophen as needed for pain.  Your urine has been sent for culture, which will be ready in two days. If the culture shows you need to be on a different antibiotic, we will contact you.

## 2017-03-23 NOTE — ED Notes (Signed)
Pt ambulatory to waiting room. Pt verbalized understanding of discharge instructions.   

## 2017-03-23 NOTE — ED Provider Notes (Signed)
[facility] EMERGENCY DEPARTMENT Provider Note   CSN: [id number] Arrival date & time: 03/22/17  2032     History   Chief Complaint Chief Complaint  Patient presents with  . Flank Pain    HPI Candace [name] is a 71 y.o. female.  The history is provided by the patient.  She complains of pain in the left flank area for the last 3 days. Pain is present mainly when she coughs. She is not producing any sputum, and states she is actually been coughing just to see if the pain is still there. She denies fever or dyspnea. There is no nausea or vomiting. There is no radiation of pain.She denies urinary urgency, frequency, tenesmus, dysuria. Pain is rated at 6/10. She has not taken anything for the pain.  Past Medical History:  Diagnosis Date  . Erosive esophagitis   . GERD (gastroesophageal reflux disease)   . Hypertension   . Hypertension     Patient Active Problem List   Diagnosis Date Noted  . Special screening for malignant neoplasms, colon 03/09/2016  . GERD (gastroesophageal reflux disease) 10/13/2011  . Hypertension 10/13/2011  . Erosive esophagitis 10/13/2011    Past Surgical History:  Procedure Laterality Date  . ABDOMINAL HYSTERECTOMY    . ABDOMINAL SURGERY    . COLONOSCOPY N/A 06/16/2016   Procedure: COLONOSCOPY;  Surgeon: Rogene Houston, MD;  [facility];  Service: Endoscopy;  Laterality: N/A;  1030  . ESOPHAGOGASTRODUODENOSCOPY  11/10/2011   Procedure: ESOPHAGOGASTRODUODENOSCOPY (EGD);  Surgeon: Rogene Houston, MD;  Location: [facility];  Service: Endoscopy;  Laterality: N/A;  1:00 pm  . 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 Authorizing Provider  docusate sodium (COLACE) 100 MG capsule Take 100 mg by mouth daily as needed for mild constipation.     [provider]  famotidine (PEPCID) 20 MG tablet Take 20 mg by mouth daily.    [provider]  hydrochlorothiazide (MICROZIDE) 12.5 MG capsule Take 12.5 mg by mouth daily.    [provider]  meclizine (ANTIVERT) 25 MG tablet Take 25 mg by mouth 3 (three) times daily as needed. For dizziness    [provider]  OLANZapine (ZYPREXA) 5 MG tablet Take 5 mg by mouth at bedtime.    [provider]  potassium chloride (KLOR-CON) 20 MEQ packet Take 20 mEq by mouth daily.    [provider]  pravastatin (PRAVACHOL) 20 MG tablet Take 20 mg by mouth at bedtime.      [provider]    Family History No family history on file.  Social History Social History  Substance Use Topics  . Smoking status: Never Smoker  . Smokeless tobacco: Former Systems developer  . Alcohol use No     Allergies   Aspirin and Penicillins   Review of Systems Review of Systems  All other systems reviewed and are negative.    Physical Exam Updated Vital Signs BP (!) 153/85 (BP Location: Left Arm)   Pulse 98   Temp 99.7 F (37.6 C) (Oral)   Resp 18   Ht 5\' 4"  (1.626 m)   Wt 68 kg (150 lb)   SpO2 97%   BMI 25.75 kg/m  Physical Exam  Nursing note and vitals reviewed.  71 year old female, resting comfortably and in no acute distress. Vital signs are significant for hypertension. Oxygen saturation is 97%, which is normal. Head is normocephalic and atraumatic. PERRLA, EOMI. Oropharynx is clear. Neck is nontender and supple without adenopathy or JVD. Back is nontender in the midline. There is mild leftCVA tenderness. Lungs are clear without rales, wheezes, or rhonchi. Chest is nontender. Heart has regular rate and rhythm without murmur. Abdomen is soft, flat, with mild tenderness in the left lower quadrant. There is no rebound or guarding. There are no masses or hepatosplenomegaly and peristalsis is normoactive. Extremities have no cyanosis or edema, full range of  motion is present. Skin is warm and dry without rash. Neurologic: Mental status is normal, cranial nerves are intact, there are no motor or sensory deficits.  ED Treatments / Results  Labs (all labs ordered are listed, but only abnormal results are displayed) Labs Reviewed  URINALYSIS, ROUTINE W REFLEX MICROSCOPIC - Abnormal; Notable for the following:       Result Value   APPearance CLOUDY (*)    Hgb urine dipstick MODERATE (*)    Ketones, ur 5 (*)    Nitrite POSITIVE (*)    Leukocytes, UA LARGE (*)    Bacteria, UA RARE (*)    Squamous Epithelial / LPF 0-5 (*)    All other components within normal limits  URINE CULTURE   Procedures Procedures (including critical care time)  Medications Ordered in ED Medications  nitrofurantoin (MACRODANTIN) capsule 100 mg (not administered)     Initial Impression / Assessment and Plan / ED Course  I have reviewed the triage vital signs and the nursing notes.  Pertinent lab results that were available during my care of the patient were reviewed by me and considered in my medical decision making (see chart for details).  Left flank pain which appears to be due to urinary tract infection. Urinalysis has positive nitrite and too numerous to count WBCs. Urine is sent for culture. Old records are reviewed, and she has no relevant past visits. Urine is sent for culture and she is discharged with prescription for nitrofurantoin. She is to follow-up with PCP to recheck urinalysis after completion of antibiotic course. Return precautions discussed.  Final Clinical Impressions(s) / ED Diagnoses   Final diagnoses:  Urinary tract infection without hematuria, site unspecified  Left flank pain    New Prescriptions New Prescriptions   NITROFURANTOIN, MACROCRYSTAL-MONOHYDRATE, (MACROBID) 100 MG CAPSULE    Take 1 capsule (100 mg total) by mouth 2 (two) times daily. X 7 days     Delora Fuel, MD 71/21/97 [phone]

## 2017-03-25 LAB — URINE CULTURE

## 2017-03-26 ENCOUNTER — Telehealth: Payer: Self-pay

## 2017-03-26 NOTE — Telephone Encounter (Signed)
Post ED Visit - Positive Culture Follow-up  Culture report reviewed by antimicrobial stewardship pharmacist:  []  Elenor Quinones, Pharm.D. []  Heide Guile, Pharm.D., BCPS AQ-ID []  Parks Neptune, Pharm.D., BCPS []  Alycia Rossetti, Pharm.D., BCPS []  Yonah, Pharm.D., BCPS, AAHIVP []  Legrand Como, Pharm.D., BCPS, AAHIVP []  Salome Arnt, PharmD, BCPS [x]  Dimitri Ped, PharmD, BCPS []  Vincenza Hews, PharmD, BCPS  Positive urine culture Treated with Cephalexin, organism sensitive to the same and no further patient follow-up is required at this time.  Genia Del 03/26/2017, 12:07 PM

## 2017-04-08 ENCOUNTER — Emergency Department (HOSPITAL_COMMUNITY)
Admission: EM | Admit: 2017-04-08 | Discharge: 2017-04-09 | Disposition: A | Discharge status: Home / Self Care | Payer: Medicare HMO | Attending: Emergency Medicine | Admitting: Emergency Medicine

## 2017-04-08 ENCOUNTER — Encounter (HOSPITAL_COMMUNITY): Payer: Self-pay | Admitting: *Deleted

## 2017-04-08 DIAGNOSIS — K219 Gastro-esophageal reflux disease without esophagitis: Secondary | ICD-10-CM | POA: Diagnosis not present

## 2017-04-08 DIAGNOSIS — Z79899 Other long term (current) drug therapy: Secondary | ICD-10-CM | POA: Insufficient documentation

## 2017-04-08 DIAGNOSIS — Z7902 Long term (current) use of antithrombotics/antiplatelets: Secondary | ICD-10-CM | POA: Diagnosis not present

## 2017-04-08 DIAGNOSIS — I10 Essential (primary) hypertension: Secondary | ICD-10-CM | POA: Diagnosis not present

## 2017-04-08 DIAGNOSIS — R1013 Epigastric pain: Secondary | ICD-10-CM | POA: Diagnosis present

## 2017-04-08 LAB — CBC WITH DIFFERENTIAL/PLATELET
Basophils Absolute: 0 10*3/uL (ref 0.0–0.1)
Basophils Relative: 1 %
Eosinophils Absolute: 0.1 10*3/uL (ref 0.0–0.7)
Eosinophils Relative: 2 %
HCT: 34.5 % — ABNORMAL LOW (ref 36.0–46.0)
HEMOGLOBIN: 11.1 g/dL — AB (ref 12.0–15.0)
LYMPHS ABS: 1.7 10*3/uL (ref 0.7–4.0)
LYMPHS PCT: 38 %
MCH: 28.5 pg (ref 26.0–34.0)
MCHC: 32.2 g/dL (ref 30.0–36.0)
MCV: 88.5 fL (ref 78.0–100.0)
Monocytes Absolute: 0.5 10*3/uL (ref 0.1–1.0)
Monocytes Relative: 10 %
NEUTROS ABS: 2.2 10*3/uL (ref 1.7–7.7)
NEUTROS PCT: 49 %
Platelets: 433 10*3/uL — ABNORMAL HIGH (ref 150–400)
RBC: 3.9 MIL/uL (ref 3.87–5.11)
RDW: 14.3 % (ref 11.5–15.5)
WBC: 4.4 10*3/uL (ref 4.0–10.5)

## 2017-04-08 NOTE — ED Triage Notes (Signed)
Pt with burning to epigastric area after eating for past few days.  Pt with hx of GERD and takes an acid reducer.

## 2017-04-08 NOTE — ED Notes (Signed)
Pt has a hx of both GERD and esophagus px  Presents with complaint of heartburn after eating for the last 3 days

## 2017-04-09 LAB — COMPREHENSIVE METABOLIC PANEL
ALT: 13 U/L — ABNORMAL LOW (ref 14–54)
ANION GAP: 5 (ref 5–15)
AST: 17 U/L (ref 15–41)
Albumin: 3.3 g/dL — ABNORMAL LOW (ref 3.5–5.0)
Alkaline Phosphatase: 87 U/L (ref 38–126)
BUN: 13 mg/dL (ref 6–20)
CHLORIDE: 102 mmol/L (ref 101–111)
CO2: 29 mmol/L (ref 22–32)
Calcium: 8.6 mg/dL — ABNORMAL LOW (ref 8.9–10.3)
Creatinine, Ser: 1.02 mg/dL — ABNORMAL HIGH (ref 0.44–1.00)
GFR, EST NON AFRICAN AMERICAN: 54 mL/min — AB (ref >60)
Glucose, Bld: 108 mg/dL — ABNORMAL HIGH (ref 65–99)
Potassium: 3.4 mmol/L — ABNORMAL LOW (ref 3.5–5.1)
SODIUM: 136 mmol/L (ref 135–145)
Total Bilirubin: 0.3 mg/dL (ref 0.3–1.2)
Total Protein: 7.4 g/dL (ref 6.5–8.1)

## 2017-04-09 LAB — LIPASE, BLOOD: Lipase: 18 U/L (ref 11–51)

## 2017-04-09 LAB — TROPONIN I

## 2017-04-09 MED ORDER — OMEPRAZOLE 20 MG PO CPDR: 20.0000 mg | DELAYED_RELEASE_CAPSULE | Freq: Every day | ORAL | 0 refills | Status: DC

## 2017-04-09 MED ORDER — PANTOPRAZOLE SODIUM 40 MG PO TBEC
40.0000 mg | DELAYED_RELEASE_TABLET | Freq: Once | ORAL | Status: AC
  Administered 2017-04-09: 40 mg via ORAL
  Filled 2017-04-09: qty 1

## 2017-04-09 MED ORDER — FAMOTIDINE 20 MG PO TABS
20.0000 mg | ORAL_TABLET | Freq: Once | ORAL | Status: AC
  Administered 2017-04-09: 20 mg via ORAL
  Filled 2017-04-09: qty 1

## 2017-04-09 NOTE — Discharge Instructions (Signed)
Your vital signs within normal limits.  Your heart enzymes and EKG are negative for acute event.  The remainder of your blood work is negative for acute problem.  I suspect that your symptoms are related to reflux disease.  Please refrain from fried, spicy, and dairy products for now.  Please increase your Pepcid to 2 times daily.  And please add Prilosec 20 mg daily.  Please discuss these medication changes with your doctor as soon as possible.  Please return to the emergency department if any changes, problems, or concerns.

## 2017-04-10 NOTE — ED Provider Notes (Signed)
[facility] EMERGENCY DEPARTMENT Provider Note   CSN: [id number] Arrival date & time: 04/08/17  2153     History   Chief Complaint Chief Complaint  Patient presents with  . Abdominal Pain    HPI Candace [name] is a 71 y.o. female.  Patient is a 71 year old female who presents to the emergency department with a complaint of abdominal pain in the epigastric area.  The patient states that she has reflux disease has been treated in the past for erosive esophagitis.  She states that recently she has been noticing more and more heartburn.  This occurs when she is eating.  It is worse when she is eating fried foods, fatty foods, or dairy products.  Her last episode was this morning at breakfast.  She had fried fish, eggs, and cheese for breakfast.  She has not been vomiting, vomiting blood, or spitting blood.  It is of note that she has had her gallbladder removed in the past.  She has not had any chest pain, neck pain, or jaw pain.  There is been no loss of consciousness.  No changes in mental status.  No blood in stools.  She was concerned because the problem seems to be getting worse instead of getting better in spite of her use of Pepcid.  She request to be evaluated for this problem.      Past Medical History:  Diagnosis Date  . Erosive esophagitis   . GERD (gastroesophageal reflux disease)   . Hypertension   . Hypertension     Patient Active Problem List   Diagnosis Date Noted  . Special screening for malignant neoplasms, colon 03/09/2016  . GERD (gastroesophageal reflux disease) 10/13/2011  . Hypertension 10/13/2011  . Erosive esophagitis 10/13/2011    Past Surgical History:  Procedure Laterality Date  . ABDOMINAL HYSTERECTOMY    . ABDOMINAL SURGERY    . 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 Authorizing Provider  docusate sodium (COLACE) 100 MG capsule Take 100 mg by mouth daily as needed for mild constipation.    Yes [provider]  famotidine (PEPCID) 20 MG tablet Take 20 mg by mouth daily.   Yes [provider]  hydrochlorothiazide (MICROZIDE) 12.5 MG capsule Take 12.5 mg by mouth daily.   Yes [provider]  meclizine (ANTIVERT) 25 MG tablet Take 25 mg by mouth 3 (three) times daily as needed. For dizziness   Yes [provider]  Multiple Vitamin (MULTIVITAMIN WITH MINERALS) TABS tablet Take 1 tablet by mouth daily.   Yes [provider]  OLANZapine (ZYPREXA) 5 MG tablet Take 5 mg by mouth at bedtime.   Yes [provider]  potassium chloride (KLOR-CON) 20 MEQ packet Take 20 mEq by mouth daily.   Yes [provider]  pravastatin (PRAVACHOL) 20 MG tablet Take 20 mg by mouth at bedtime.     Yes [provider]  omeprazole (PRILOSEC) 20 MG capsule Take 1 capsule (20 mg total) by mouth daily. 04/09/17   [name], PA-C    Family History History reviewed. No pertinent family history.  Social History Social History   Tobacco Use  . Smoking status: Never Smoker  . Smokeless tobacco: Former Building surveyor  Topics  . Alcohol use: No  . Drug use: No     Allergies   Diclofenac sodium; Aspirin; and Penicillins   Review of Systems Review of Systems  Constitutional: Negative for activity change.       All ROS Neg except as noted in HPI  HENT: Negative for nosebleeds.   Eyes: Negative for photophobia and discharge.  Respiratory: Negative for cough, shortness of breath and wheezing.   Cardiovascular: Negative for chest pain and palpitations.  Gastrointestinal: Positive for abdominal pain. Negative for blood in stool.       Heartburn  Genitourinary: Negative for dysuria, frequency and hematuria.  Musculoskeletal: Negative for arthralgias, back pain and neck pain.  Skin: Negative.    Neurological: Negative for dizziness, seizures and speech difficulty.  Psychiatric/Behavioral: Negative for confusion and hallucinations.     Physical Exam Updated Vital Signs BP 130/86 (BP Location: Left Arm)   Pulse 75   Temp 97.9 F (36.6 C) (Oral)   Resp (!) 21   Ht 5\' 4"  (1.626 m)   Wt 68 kg (150 lb)   SpO2 99%   BMI 25.75 kg/m   Physical Exam  Constitutional: She is oriented to person, place, and time. She appears well-developed and well-nourished.  Non-toxic appearance.  HENT:  Head: Normocephalic.  Right Ear: Tympanic membrane and external ear normal.  Left Ear: Tympanic membrane and external ear normal.  Eyes: EOM and lids are normal. Pupils are equal, round, and reactive to light.  Neck: Normal range of motion. Neck supple. Carotid bruit is not present.  Cardiovascular: Normal rate, regular rhythm, normal heart sounds, intact distal pulses and normal pulses.  Pulmonary/Chest: Breath sounds normal. No respiratory distress.  Abdominal: Soft. Bowel sounds are normal. She exhibits no mass. There is tenderness. There is no guarding.  Patient has mild epigastric tenderness to palpation.  There is no mass or tumoral pulsating mass.  There is no distention appreciated.  Musculoskeletal: Normal range of motion.  Lymphadenopathy:       Head (right side): No submandibular adenopathy present.       Head (left side): No submandibular adenopathy present.    She has no cervical adenopathy.  Neurological: She is alert and oriented to person, place, and time. She has normal strength. No cranial nerve deficit or sensory deficit.  Skin: Skin is warm and dry.  Psychiatric: She has a normal mood and affect. Her speech is normal.  Nursing note and vitals reviewed.    ED Treatments / Results  Labs (all labs ordered are listed, but only abnormal results are displayed) Labs Reviewed  COMPREHENSIVE METABOLIC PANEL - Abnormal; Notable for the following components:      Result Value    Potassium 3.4 (*)    Glucose, Bld 108 (*)    Creatinine, Ser 1.02 (*)    Calcium 8.6 (*)    Albumin 3.3 (*)    ALT 13 (*)    GFR calc non Af Amer 54 (*)    All other components within normal limits  CBC WITH DIFFERENTIAL/PLATELET - Abnormal; Notable for the following components:   Hemoglobin 11.1 (*)    HCT 34.5 (*)    Platelets 433 (*)    All other components within normal limits  TROPONIN I  LIPASE, BLOOD    EKG  EKG Interpretation  Date/Time:  Saturday April 08 2017 22:08:45 EDT Ventricular Rate:  75 PR Interval:    QRS Duration: 95 QT Interval:  412 QTC Calculation: 461 R Axis:  20 Text Interpretation:  Sinus rhythm Low voltage, precordial leads Probable anteroseptal infarct, old No significant change since last tracing Confirmed by Dorie Rank [phone]) on 04/08/2017 10:11:12 PM       Radiology No results found.  Procedures Procedures (including critical care time)  Medications Ordered in ED Medications  famotidine (PEPCID) tablet 20 mg (20 mg Oral Given 04/09/17 0051)  pantoprazole (PROTONIX) EC tablet 40 mg (40 mg Oral Given 04/09/17 0051)     Initial Impression / Assessment and Plan / ED Course  I have reviewed the triage vital signs and the nursing notes.  Pertinent labs & imaging results that were available during my care of the patient were reviewed by me and considered in my medical decision making (see chart for details).       Final Clinical Impressions(s) / ED Diagnoses MDM Vital signs reviewed.  Pulse oximetry is 98-99% on room air.  Within normal limits by my interpretation. Electrocardiogram and troponin are negative for acute event.  Conference of metabolic panel shows potassium to be slightly low at 3.4, with creatinine slightly elevated at 1.02 otherwise within normal limits.  Lipase is normal at 18.  Complete blood count is not acute. Patient seen with me by Dr. Tomi Bamberger.  The patient has had her gallbladder removed.  She has not had any  fever or chills to be reported.  No excessive vomiting.  No excessive diarrhea.  No blood in stool.  I suspect that her symptoms are related to GERD.  The patient's Pepcid will be increased to twice daily, and we will add a proton pump inhibitor.  I have asked the patient to follow-up with her primary physician at the [facility] as soon as possible.  She will return to the emergency department if any emergent changes, problems, or concerns.  The patient is in agreement with this plan.   Final diagnoses:  Gastroesophageal reflux disease without esophagitis    ED Discharge Orders        Ordered    omeprazole (PRILOSEC) 20 MG capsule  Daily     04/09/17 0104       [name], PA-C 04/10/17 1427    Dorie Rank, MD 04/11/17 [phone]

## 2018-08-08 ENCOUNTER — Other Ambulatory Visit: Payer: Self-pay

## 2018-08-08 ENCOUNTER — Encounter (HOSPITAL_COMMUNITY): Payer: Self-pay

## 2018-08-08 ENCOUNTER — Emergency Department (HOSPITAL_COMMUNITY)
Admission: EM | Admit: 2018-08-08 | Discharge: 2018-08-08 | Disposition: A | Discharge status: Home / Self Care | Payer: Medicare HMO | Attending: Emergency Medicine | Admitting: Emergency Medicine

## 2018-08-08 DIAGNOSIS — R51 Headache: Secondary | ICD-10-CM | POA: Diagnosis present

## 2018-08-08 DIAGNOSIS — Z79899 Other long term (current) drug therapy: Secondary | ICD-10-CM | POA: Insufficient documentation

## 2018-08-08 DIAGNOSIS — J0110 Acute frontal sinusitis, unspecified: Secondary | ICD-10-CM | POA: Insufficient documentation

## 2018-08-08 DIAGNOSIS — I10 Essential (primary) hypertension: Secondary | ICD-10-CM | POA: Insufficient documentation

## 2018-08-08 MED ORDER — DOXYCYCLINE HYCLATE 100 MG PO CAPS: 100.0000 mg | ORAL_CAPSULE | Freq: Two times a day (BID) | ORAL | 0 refills | Status: DC

## 2018-08-08 NOTE — ED Triage Notes (Signed)
Pt reports facial pain in forehead and eyes since Saturday. Nasal drainage

## 2018-08-08 NOTE — Discharge Instructions (Signed)
Return if any problems.

## 2018-08-08 NOTE — ED Provider Notes (Signed)
[facility] EMERGENCY DEPARTMENT Provider Note   CSN: [id number] Arrival date & time: 08/08/18  1045    History   Chief Complaint Chief Complaint  Patient presents with  . Facial Pain    HPI Candace [name] is a 73 y.o. female.     The history is provided by the patient. No language interpreter was used.  URI  Presenting symptoms: congestion, cough and facial pain   Severity:  Moderate Onset quality:  Gradual Duration:  4 days Timing:  Constant Progression:  Worsening Chronicity:  New Relieved by:  Nothing Worsened by:  Nothing Ineffective treatments:  None tried Associated symptoms: sinus pain   Risk factors: being elderly   Pt reports she has a history of sinus problems.   Past Medical History:  Diagnosis Date  . Erosive esophagitis   . GERD (gastroesophageal reflux disease)   . Hypertension   . Hypertension     Patient Active Problem List   Diagnosis Date Noted  . Special screening for malignant neoplasms, colon 03/09/2016  . GERD (gastroesophageal reflux disease) 10/13/2011  . Hypertension 10/13/2011  . Erosive esophagitis 10/13/2011    Past Surgical History:  Procedure Laterality Date  . ABDOMINAL HYSTERECTOMY    . ABDOMINAL SURGERY    . COLONOSCOPY N/A 06/16/2016   Procedure: COLONOSCOPY;  Surgeon: Rogene Houston, MD;  [facility];  Service: Endoscopy;  Laterality: N/A;  1030  . ESOPHAGOGASTRODUODENOSCOPY  11/10/2011   Procedure: ESOPHAGOGASTRODUODENOSCOPY (EGD);  Surgeon: Rogene Houston, MD;  [facility];  Service: Endoscopy;  Laterality: N/A;  1:00 pm  . 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      Home Medications    Prior to Admission medications   Medication Sig Start Date End Date Taking? Authorizing Provider  docusate sodium (COLACE) 100 MG capsule Take 100 mg by mouth daily as needed for mild constipation.     [provider]  doxycycline (VIBRAMYCIN) 100 MG capsule Take 1 capsule (100 mg total) by mouth 2 (two) times daily. 08/08/18   Fransico Meadow, PA-C  famotidine (PEPCID) 20 MG tablet Take 20 mg by mouth daily.    [provider]  hydrochlorothiazide (MICROZIDE) 12.5 MG capsule Take 12.5 mg by mouth daily.    [provider]  meclizine (ANTIVERT) 25 MG tablet Take 25 mg by mouth 3 (three) times daily as needed. For dizziness    [provider]  Multiple Vitamin (MULTIVITAMIN WITH MINERALS) TABS tablet Take 1 tablet by mouth daily.    [provider]  OLANZapine (ZYPREXA) 5 MG tablet Take 5 mg by mouth at bedtime.    [provider]  omeprazole (PRILOSEC) 20 MG capsule Take 1 capsule (20 mg total) by mouth daily. 04/09/17   [name], PA-C  potassium chloride (KLOR-CON) 20 MEQ packet Take 20 mEq by mouth daily.    [provider]  pravastatin (PRAVACHOL) 20 MG tablet Take 20 mg by mouth at bedtime.      [provider]  divalproex (DEPAKOTE) 250 MG EC tablet Take 250 mg by mouth 2 (two) times daily.    08/29/11  [provider]    Family History No family history on file.  Social History Social History   Tobacco Use  . Smoking status: Never Smoker  . Smokeless tobacco: Former Network engineer  Use Topics  . Alcohol use: No  . Drug use: No     Allergies   Diclofenac sodium; Aspirin; and Penicillins   Review of Systems Review of Systems  HENT: Positive for congestion and sinus pain.   Respiratory: Positive for cough.   All other systems reviewed and are negative.    Physical Exam Updated Vital Signs BP (!) 148/95 (BP Location: Right Arm)   Pulse 86   Temp 99.1 F (37.3 C) (Oral)   Resp 15   Ht 5\' 4"  (1.626 m)   Wt 65.8 kg   SpO2 100%   BMI 24.89 kg/m   Physical Exam Vitals signs and nursing note reviewed.  Constitutional:      Appearance: She is well-developed.  HENT:     Head: Normocephalic.       Comments: Tender maxillary and frontal sinuses    Right Ear: Tympanic membrane normal.     Left Ear: Tympanic membrane normal.     Nose: Nose normal.     Mouth/Throat:     Mouth: Mucous membranes are moist.  Eyes:     Pupils: Pupils are equal, round, and reactive to light.  Neck:     Musculoskeletal: Normal range of motion.  Cardiovascular:     Rate and Rhythm: Normal rate.     Pulses: Normal pulses.  Pulmonary:     Effort: Pulmonary effort is normal.  Abdominal:     General: Abdomen is flat. There is no distension.  Musculoskeletal: Normal range of motion.  Neurological:     Mental Status: She is alert and oriented to person, place, and time.  Psychiatric:        Mood and Affect: Mood normal.      ED Treatments / Results  Labs (all labs ordered are listed, but only abnormal results are displayed) Labs Reviewed - No data to display  EKG None  Radiology No results found.  Procedures Procedures (including critical care time)  Medications Ordered in ED Medications - No data to display   Initial Impression / Assessment and Plan / ED Course  I have reviewed the triage vital signs and the nursing notes.  Pertinent labs & imaging results that were available during my care of the patient were reviewed by me and considered in my medical decision making (see chart for details).        MMD   Pt has had sinus infections in the past.  Pt reports this feels the same   Final Clinical Impressions(s) / ED Diagnoses   Final diagnoses:  Acute frontal sinusitis, recurrence not specified    ED Discharge Orders         Ordered    doxycycline (VIBRAMYCIN) 100 MG capsule  2 times daily     08/08/18 1231        An After Visit Summary was printed and given to the patient.    Sidney Ace 08/08/18 1241    Davonna Belling, MD 08/08/18 [phone]

## 2018-09-08 ENCOUNTER — Other Ambulatory Visit: Payer: Self-pay

## 2018-09-08 ENCOUNTER — Emergency Department (HOSPITAL_COMMUNITY): Payer: Medicare HMO

## 2018-09-08 ENCOUNTER — Encounter (HOSPITAL_COMMUNITY): Payer: Self-pay

## 2018-09-08 ENCOUNTER — Emergency Department (HOSPITAL_COMMUNITY)
Admission: EM | Admit: 2018-09-08 | Discharge: 2018-09-08 | Disposition: A | Discharge status: Home / Self Care | Payer: Medicare HMO | Attending: Emergency Medicine | Admitting: Emergency Medicine

## 2018-09-08 DIAGNOSIS — R739 Hyperglycemia, unspecified: Secondary | ICD-10-CM

## 2018-09-08 DIAGNOSIS — R42 Dizziness and giddiness: Secondary | ICD-10-CM | POA: Diagnosis present

## 2018-09-08 DIAGNOSIS — N3001 Acute cystitis with hematuria: Secondary | ICD-10-CM

## 2018-09-08 DIAGNOSIS — Z79899 Other long term (current) drug therapy: Secondary | ICD-10-CM | POA: Insufficient documentation

## 2018-09-08 DIAGNOSIS — I10 Essential (primary) hypertension: Secondary | ICD-10-CM | POA: Insufficient documentation

## 2018-09-08 LAB — COMPREHENSIVE METABOLIC PANEL
ALT: 14 U/L (ref 0–44)
AST: 20 U/L (ref 15–41)
Albumin: 3.9 g/dL (ref 3.5–5.0)
Alkaline Phosphatase: 91 U/L (ref 38–126)
Anion gap: 9 (ref 5–15)
BUN: 15 mg/dL (ref 8–23)
CO2: 25 mmol/L (ref 22–32)
Calcium: 9.4 mg/dL (ref 8.9–10.3)
Chloride: 101 mmol/L (ref 98–111)
Creatinine, Ser: 0.91 mg/dL (ref 0.44–1.00)
GFR calc Af Amer: >60 mL/min (ref >60)
GFR calc non Af Amer: >60 mL/min (ref >60)
Glucose, Bld: 174 mg/dL — ABNORMAL HIGH (ref 70–99)
Potassium: 3.3 mmol/L — ABNORMAL LOW (ref 3.5–5.1)
Sodium: 135 mmol/L (ref 135–145)
Total Bilirubin: 0.2 mg/dL — ABNORMAL LOW (ref 0.3–1.2)
Total Protein: 8.1 g/dL (ref 6.5–8.1)

## 2018-09-08 LAB — TROPONIN I: Troponin I: <0.03 ng/mL (ref <0.03)

## 2018-09-08 LAB — CBC WITH DIFFERENTIAL/PLATELET
Abs Immature Granulocytes: 0 10*3/uL (ref 0.00–0.07)
Basophils Absolute: 0 10*3/uL (ref 0.0–0.1)
Basophils Relative: 1 %
Eosinophils Absolute: 0.1 10*3/uL (ref 0.0–0.5)
Eosinophils Relative: 1 %
HCT: 38.5 % (ref 36.0–46.0)
Hemoglobin: 12.1 g/dL (ref 12.0–15.0)
Immature Granulocytes: 0 %
Lymphocytes Relative: 37 %
Lymphs Abs: 1.5 10*3/uL (ref 0.7–4.0)
MCH: 28.5 pg (ref 26.0–34.0)
MCHC: 31.4 g/dL (ref 30.0–36.0)
MCV: 90.6 fL (ref 80.0–100.0)
Monocytes Absolute: 0.7 10*3/uL (ref 0.1–1.0)
Monocytes Relative: 16 %
Neutro Abs: 1.9 10*3/uL (ref 1.7–7.7)
Neutrophils Relative %: 45 %
Platelets: 239 10*3/uL (ref 150–400)
RBC: 4.25 MIL/uL (ref 3.87–5.11)
RDW: 13.3 % (ref 11.5–15.5)
WBC: 4.1 10*3/uL (ref 4.0–10.5)
nRBC: 0 % (ref 0.0–0.2)

## 2018-09-08 LAB — URINALYSIS, ROUTINE W REFLEX MICROSCOPIC
Bacteria, UA: NONE SEEN
Bilirubin Urine: NEGATIVE
Glucose, UA: NEGATIVE mg/dL
Ketones, ur: NEGATIVE mg/dL
Nitrite: NEGATIVE
Protein, ur: NEGATIVE mg/dL
Specific Gravity, Urine: 1.009 (ref 1.005–1.030)
pH: 5 (ref 5.0–8.0)

## 2018-09-08 LAB — VALPROIC ACID LEVEL: Valproic Acid Lvl: <10 ug/mL — ABNORMAL LOW (ref 50.0–100.0)

## 2018-09-08 MED ORDER — FLUTICASONE PROPIONATE 50 MCG/ACT NA SUSP: 1.0000 | Freq: Every day | NASAL | 0 refills | Status: DC

## 2018-09-08 MED ORDER — MECLIZINE HCL 25 MG PO TABS: 25.0000 mg | ORAL_TABLET | Freq: Three times a day (TID) | ORAL | 0 refills | Status: DC | PRN

## 2018-09-08 MED ORDER — SULFAMETHOXAZOLE-TRIMETHOPRIM 800-160 MG PO TABS
1.0000 | ORAL_TABLET | Freq: Once | ORAL | Status: AC
  Administered 2018-09-08: 23:00:00 1 via ORAL
  Filled 2018-09-08: qty 1

## 2018-09-08 MED ORDER — SULFAMETHOXAZOLE-TRIMETHOPRIM 800-160 MG PO TABS: 1.0000 | ORAL_TABLET | Freq: Two times a day (BID) | ORAL | 0 refills | Status: AC

## 2018-09-08 MED ORDER — MECLIZINE HCL 12.5 MG PO TABS
25.0000 mg | ORAL_TABLET | Freq: Once | ORAL | Status: AC
  Administered 2018-09-08: 25 mg via ORAL
  Filled 2018-09-08: qty 2

## 2018-09-08 MED ORDER — SODIUM CHLORIDE 0.9 % IV BOLUS: 500.0000 mL | Freq: Once | INTRAVENOUS | Status: DC

## 2018-09-08 NOTE — ED Triage Notes (Signed)
Pt reports dizziness for 2-3 days. Pt was seen on 08/08/18 and diagnosed with sinus infection, pt has completed anbx, and says she is getting dizzy sometimes when changing positions, feels better when she lays down and it will go away. Pt has not f/u with PCP as suggested on discharge instructions.

## 2018-09-08 NOTE — Discharge Instructions (Addendum)
Your blood sugar was elevated at 174.  You will need to get this rechecked by your pcp in a few weeks.

## 2018-09-08 NOTE — ED Notes (Signed)
Pt refused IVF. Said that she will just drink a lot of water

## 2018-09-08 NOTE — ED Provider Notes (Signed)
[facility] EMERGENCY DEPARTMENT Provider Note   CSN: [id number] Arrival date & time: 09/08/18  2125    History   Chief Complaint Chief Complaint  Patient presents with  . Dizziness    HPI Candace [name] is a 73 y.o. female.     Pt presents to the ED today with dizziness.  She said she has been dizzy for a few weeks.  She was here on 3/4 and was diagnosed with a sinus infection.  She was given a rx for doxycycline and said she took all the pills.  However, she brought the bottle and there were several left.  Pt said she feels "swimmy headed."  She does not feel that anything makes the dizziness worse.  She has had inner ear problems in the past, but has not tried meclizine.     Past Medical History:  Diagnosis Date  . Erosive esophagitis   . GERD (gastroesophageal reflux disease)   . Hypertension   . Hypertension     Patient Active Problem List   Diagnosis Date Noted  . Special screening for malignant neoplasms, colon 03/09/2016  . GERD (gastroesophageal reflux disease) 10/13/2011  . Hypertension 10/13/2011  . Erosive esophagitis 10/13/2011    Past Surgical History:  Procedure Laterality Date  . ABDOMINAL HYSTERECTOMY    . ABDOMINAL SURGERY    . COLONOSCOPY N/A 06/16/2016   Procedure: COLONOSCOPY;  Surgeon: Rogene Houston, MD;  [facility];  Service: Endoscopy;  Laterality: N/A;  1030  . ESOPHAGOGASTRODUODENOSCOPY  11/10/2011   Procedure: ESOPHAGOGASTRODUODENOSCOPY (EGD);  Surgeon: Rogene Houston, MD;  Location: [facility] ENDO [facility];  Service: Endoscopy;  Laterality: N/A;  1:00 pm  . 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      Home Medications    Prior to Admission medications   Medication Sig Start Date End Date Taking? Authorizing Provider  docusate sodium (COLACE) 100 MG capsule Take 100 mg by mouth daily as needed for mild constipation.     [provider]  famotidine (PEPCID) 20 MG tablet Take 20 mg by mouth daily.    [provider]  fluticasone (FLONASE) 50 MCG/ACT nasal spray Place 1 spray into both nostrils daily. 09/08/18   [name], MD  hydrochlorothiazide (MICROZIDE) 12.5 MG capsule Take 12.5 mg by mouth daily.    [provider]  meclizine (ANTIVERT) 25 MG tablet Take 1 tablet (25 mg total) by mouth 3 (three) times daily as needed for dizziness. 09/08/18   [name], MD  Multiple Vitamin (MULTIVITAMIN WITH MINERALS) TABS tablet Take 1 tablet by mouth daily.    [provider]  OLANZapine (ZYPREXA) 5 MG tablet Take 5 mg by mouth at bedtime.    [provider]  omeprazole (PRILOSEC) 20 MG capsule Take 1 capsule (20 mg total) by mouth daily. 04/09/17   [name], PA-C  potassium chloride (KLOR-CON) 20 MEQ packet Take 20 mEq by mouth daily.    [provider]  pravastatin (PRAVACHOL) 20 MG tablet Take 20 mg by mouth at bedtime.      [provider]  sulfamethoxazole-trimethoprim (BACTRIM DS,SEPTRA DS) 800-160 MG tablet Take 1 tablet by mouth 2 (two) times daily for 7 days. 09/08/18 09/15/18  [name], MD  divalproex (DEPAKOTE) 250 MG EC tablet Take 250 mg by mouth 2 (two) times daily.  08/29/11  [provider]    Family History No family history on file.  Social History Social History   Tobacco Use  . Smoking status: Never Smoker  . Smokeless tobacco: Former Network engineer Use Topics  . Alcohol use: No  . Drug use: No     Allergies   Diclofenac sodium; Aspirin; and Penicillins   Review of Systems Review of Systems  Neurological: Positive for dizziness.  All other systems reviewed and are negative.    Physical Exam Updated Vital Signs BP (!) 154/109   Pulse 92   Temp (!) 97.4 F (36.3 C) (Oral)   Resp 18   Ht 5\' 4"  (1.626 m)   Wt 65.8 kg   SpO2 100%   BMI 24.89 kg/m   Physical Exam Vitals signs and nursing note reviewed.   Constitutional:      Appearance: Normal appearance.  HENT:     Head: Normocephalic and atraumatic.     Right Ear: External ear normal.     Left Ear: External ear normal.     Nose: Nose normal.     Mouth/Throat:     Mouth: Mucous membranes are moist.  Eyes:     Extraocular Movements: Extraocular movements intact.     Conjunctiva/sclera: Conjunctivae normal.     Pupils: Pupils are equal, round, and reactive to light.  Neck:     Musculoskeletal: Normal range of motion and neck supple.  Cardiovascular:     Rate and Rhythm: Normal rate and regular rhythm.     Pulses: Normal pulses.     Heart sounds: Normal heart sounds.  Pulmonary:     Effort: Pulmonary effort is normal.     Breath sounds: Normal breath sounds.  Abdominal:     General: Abdomen is flat. Bowel sounds are normal.     Palpations: Abdomen is soft.  Musculoskeletal: Normal range of motion.  Skin:    General: Skin is warm.     Capillary Refill: Capillary refill takes less than 2 seconds.  Neurological:     General: No focal deficit present.     Mental Status: She is alert and oriented to person, place, and time.  Psychiatric:        Mood and Affect: Mood normal.        Behavior: Behavior normal.      ED Treatments / Results  Labs (all labs ordered are listed, but only abnormal results are displayed) Labs Reviewed  COMPREHENSIVE METABOLIC PANEL - Abnormal; Notable for the following components:      Result Value   Potassium 3.3 (*)    Glucose, Bld 174 (*)    Total Bilirubin 0.2 (*)    All other components within normal limits  URINALYSIS, ROUTINE W REFLEX MICROSCOPIC - Abnormal; Notable for the following components:   Color, Urine STRAW (*)    Hgb urine dipstick SMALL (*)    Leukocytes,Ua LARGE (*)    All other components within normal limits  VALPROIC ACID LEVEL - Abnormal; Notable for the following components:   Valproic Acid Lvl <10 (*)    All other components within normal limits  TROPONIN I  CBC WITH  DIFFERENTIAL/PLATELET  CBC WITH DIFFERENTIAL/PLATELET    EKG EKG Interpretation  Date/Time:  Saturday September 08 2018 22:34:21 EDT Ventricular Rate:  73 PR Interval:    QRS Duration: 88 QT Interval:  412 QTC Calculation: 454 R Axis:   -3 Text Interpretation:  Sinus rhythm Low voltage, precordial leads No significant change  since last tracing Confirmed by [name] [phone]) on 09/08/2018 10:36:45 PM   Radiology Ct Head Wo Contrast  Result Date: 09/08/2018 CLINICAL DATA:  Dizziness 3 days ago. EXAM: CT HEAD WITHOUT CONTRAST TECHNIQUE: Contiguous axial images were obtained from the base of the skull through the vertex without intravenous contrast. COMPARISON:  December 26, 2011 FINDINGS: Brain: No evidence of acute infarction, hemorrhage, hydrocephalus, extra-axial collection or mass lesion/mass effect. Vascular: No hyperdense vessel or unexpected calcification. Skull: Normal. Negative for fracture or focal lesion. Sinuses/Orbits: There is opacification inferior mastoid air cells bilaterally which is a stable finding. No fluid in the middle ears. Paranasal sinuses are normal. Other: None. IMPRESSION: No acute intracranial abnormalities. Electronically Signed   By: Dorise Bullion III M.D   On: 09/08/2018 22:42    Procedures Procedures (including critical care time)  Medications Ordered in ED Medications  sodium chloride 0.9 % bolus 500 mL (500 mLs Intravenous Refused 09/08/18 2204)  sulfamethoxazole-trimethoprim (BACTRIM DS,SEPTRA DS) 800-160 MG per tablet 1 tablet (has no administration in time range)  meclizine (ANTIVERT) tablet 25 mg (25 mg Oral Given 09/08/18 2234)     Initial Impression / Assessment and Plan / ED Course  I have reviewed the triage vital signs and the nursing notes.  Pertinent labs & imaging results that were available during my care of the patient were reviewed by me and considered in my medical decision making (see chart for details).       Pt is feeling much  better.  Her blood sugar is slightly elevated at 174.  She is told that she needs to get this rechecked by her pcp.  She is given a prediabetes eating plan.  Return if worse.  F/u with pcp.  Final Clinical Impressions(s) / ED Diagnoses   Final diagnoses:  Acute cystitis with hematuria  Vertigo  Hyperglycemia    ED Discharge Orders         Ordered    sulfamethoxazole-trimethoprim (BACTRIM DS,SEPTRA DS) 800-160 MG tablet  2 times daily     09/08/18 2301    meclizine (ANTIVERT) 25 MG tablet  3 times daily PRN     09/08/18 2301    fluticasone (FLONASE) 50 MCG/ACT nasal spray  Daily     09/08/18 2301           [name], MD 09/08/18 2315

## 2018-10-21 ENCOUNTER — Emergency Department (HOSPITAL_COMMUNITY)
Admission: EM | Admit: 2018-10-21 | Discharge: 2018-10-21 | Disposition: A | Discharge status: Home / Self Care | Payer: Medicare HMO | Attending: Emergency Medicine | Admitting: Emergency Medicine

## 2018-10-21 ENCOUNTER — Other Ambulatory Visit: Payer: Self-pay

## 2018-10-21 DIAGNOSIS — M545 Low back pain, unspecified: Secondary | ICD-10-CM

## 2018-10-21 DIAGNOSIS — H6123 Impacted cerumen, bilateral: Secondary | ICD-10-CM

## 2018-10-21 NOTE — ED Notes (Signed)
Pt here for ears wax removal   Went to PCP for same last week but they came to car and sent home with med  Here because she desires ear wax removal   Also secondary complaint of hip pain

## 2018-10-21 NOTE — ED Triage Notes (Signed)
Pt states for 3 days her left lower back/hip has been sore with no injury.  Also went to pcp last week and told she had a lot of wax in her ears making it hard for her to hear and she wants those cleaned out as the office could not due to seeing pts in the car.

## 2018-10-21 NOTE — Discharge Instructions (Addendum)
Your ear wax is deep into the ears.  You will need special tools to get it out.  Referral given to 2 different ear nose and throat groups.  Tylenol or ibuprofen for your back.

## 2018-10-21 NOTE — ED Notes (Signed)
From CT 

## 2018-10-21 NOTE — ED Provider Notes (Signed)
[facility] EMERGENCY DEPARTMENT Provider Note   CSN: [id number] Arrival date & time: 10/21/18  1255    History   Chief Complaint Chief Complaint  Patient presents with  . Back Pain    HPI Candace [name] is a 73 y.o. female.     Bilateral cerumen impaction for unknown length of time.  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  No other complaints at this time.  No dysuria, hematuria, fever, chills, radicular pain.     Past Medical History:  Diagnosis Date  . Erosive esophagitis   . GERD (gastroesophageal reflux disease)   . Hypertension   . Hypertension     Patient Active Problem List   Diagnosis Date Noted  . Special screening for malignant neoplasms, colon 03/09/2016  . GERD (gastroesophageal reflux disease) 10/13/2011  . Hypertension 10/13/2011  . Erosive esophagitis 10/13/2011    Past Surgical History:  Procedure Laterality Date  . ABDOMINAL HYSTERECTOMY    . ABDOMINAL SURGERY    . COLONOSCOPY N/A 06/16/2016   Procedure: COLONOSCOPY;  Surgeon: Rogene Houston, MD;  [facility]: [facility];  Service: Endoscopy;  Laterality: N/A;  1030  . ESOPHAGOGASTRODUODENOSCOPY  11/10/2011   Procedure: ESOPHAGOGASTRODUODENOSCOPY (EGD);  Surgeon: Rogene Houston, MD;  Location: [facility];  Service: Endoscopy;  Laterality: N/A;  1:00 pm  . 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      Home Medications    Prior to Admission medications   Medication Sig Start Date End Date Taking? Authorizing Provider  docusate sodium (COLACE) 100 MG capsule Take 100 mg by mouth daily as needed for mild constipation.     [provider]  famotidine (PEPCID) 20 MG tablet Take 20 mg by mouth daily.    [provider]  fluticasone (FLONASE) 50 MCG/ACT nasal spray Place 1 spray into both nostrils daily. 09/08/18    [name], MD  hydrochlorothiazide (MICROZIDE) 12.5 MG capsule Take 12.5 mg by mouth daily.    [provider]  meclizine (ANTIVERT) 25 MG tablet Take 1 tablet (25 mg total) by mouth 3 (three) times daily as needed for dizziness. 09/08/18   [name], MD  Multiple Vitamin (MULTIVITAMIN WITH MINERALS) TABS tablet Take 1 tablet by mouth daily.    [provider]  OLANZapine (ZYPREXA) 5 MG tablet Take 5 mg by mouth at bedtime.    [provider]  omeprazole (PRILOSEC) 20 MG capsule Take 1 capsule (20 mg total) by mouth daily. 04/09/17   [name], PA-C  potassium chloride (KLOR-CON) 20 MEQ packet Take 20 mEq by mouth daily.    [provider]  pravastatin (PRAVACHOL) 20 MG tablet Take 20 mg by mouth at bedtime.      [provider]  divalproex (DEPAKOTE) 250 MG EC tablet Take 250 mg by mouth 2 (two) times daily.    08/29/11  [provider]    Family History No family history on file.  Social History Social History   Tobacco Use  . Smoking status: Never Smoker  . Smokeless tobacco: Former Network engineer Use Topics  . Alcohol use: No  . Drug use: No     Allergies   Diclofenac sodium; Aspirin; and Penicillins   Review of Systems Review of Systems  All other systems  reviewed and are negative.    Physical Exam Updated Vital Signs BP (!) 165/87 (BP Location: Left Arm)   Pulse 88   Temp 97.9 F (36.6 C) (Oral)   Resp 18   Ht 5\' 4"  (1.626 m)   Wt 65.8 kg   SpO2 100%   BMI 24.89 kg/m   Physical Exam Vitals signs and nursing note reviewed.  Constitutional:      General: She is not in acute distress.    Appearance: She is well-developed.  HENT:     Head: Normocephalic and atraumatic.     Comments: Deep bilateral cerumen impaction Eyes:     Conjunctiva/sclera: Conjunctivae normal.  Neck:     Musculoskeletal: Neck supple.  Cardiovascular:     Rate and Rhythm: Normal rate and regular rhythm.     Heart  sounds: No murmur.  Pulmonary:     Effort: Pulmonary effort is normal. No respiratory distress.     Breath sounds: Normal breath sounds.  Abdominal:     General: Bowel sounds are normal.     Palpations: Abdomen is soft.     Tenderness: There is no abdominal tenderness.  Musculoskeletal: Normal range of motion.  Skin:    General: Skin is warm and dry.  Neurological:     Mental Status: She is alert and oriented to person, place, and time.  Psychiatric:        Behavior: Behavior normal.      ED Treatments / Results  Labs (all labs ordered are listed, but only abnormal results are displayed) Labs Reviewed - No data to display  EKG None  Radiology No results found.  Procedures Procedures (including critical care time)  Medications Ordered in ED Medications - No data to display   Initial Impression / Assessment and Plan / ED Course  I have reviewed the triage vital signs and the nursing notes.  Pertinent labs & imaging results that were available during my care of the patient were reviewed by me and considered in my medical decision making (see chart for details).        Physical exam reveals deep bilateral cerumen impaction.  I do not have the appropriate tools in the ED to resolve this.  Referral to ENT.  Suspect muscular strain of right lower back  Final Clinical Impressions(s) / ED Diagnoses   Final diagnoses:  Acute right-sided low back pain without sciatica  Bilateral impacted cerumen    ED Discharge Orders    None       Nat Christen, MD 10/21/18 1513

## 2018-11-22 ENCOUNTER — Ambulatory Visit (INDEPENDENT_AMBULATORY_CARE_PROVIDER_SITE_OTHER): Payer: Medicare HMO | Admitting: Otolaryngology

## 2018-12-13 ENCOUNTER — Ambulatory Visit (INDEPENDENT_AMBULATORY_CARE_PROVIDER_SITE_OTHER): Payer: Medicare HMO | Admitting: Otolaryngology

## 2018-12-13 DIAGNOSIS — H6123 Impacted cerumen, bilateral: Secondary | ICD-10-CM

## 2018-12-13 DIAGNOSIS — H9209 Otalgia, unspecified ear: Secondary | ICD-10-CM | POA: Diagnosis not present

## 2019-05-22 ENCOUNTER — Ambulatory Visit (INDEPENDENT_AMBULATORY_CARE_PROVIDER_SITE_OTHER): Payer: Medicare HMO | Admitting: Nurse Practitioner

## 2019-05-23 ENCOUNTER — Other Ambulatory Visit: Payer: Self-pay

## 2019-05-23 ENCOUNTER — Ambulatory Visit (INDEPENDENT_AMBULATORY_CARE_PROVIDER_SITE_OTHER): Payer: Medicare HMO | Admitting: Nurse Practitioner

## 2019-05-23 ENCOUNTER — Encounter (INDEPENDENT_AMBULATORY_CARE_PROVIDER_SITE_OTHER): Payer: Self-pay | Admitting: Nurse Practitioner

## 2019-05-23 VITALS — BP 133/85 | HR 80 | Temp 97.0°F | Ht 64.0 in | Wt 137.5 lb

## 2019-05-23 DIAGNOSIS — R140 Abdominal distension (gaseous): Secondary | ICD-10-CM | POA: Diagnosis not present

## 2019-05-23 DIAGNOSIS — K219 Gastro-esophageal reflux disease without esophagitis: Secondary | ICD-10-CM

## 2019-05-23 DIAGNOSIS — R634 Abnormal weight loss: Secondary | ICD-10-CM

## 2019-05-23 NOTE — Progress Notes (Signed)
Patient profile: Candace [name] is a 73 y.o. female seen for evaluation of abd pain, self referred. Last seen for colonoscopy 2018.   History of Present Illness: Candace [name] reports over the past 1 week developing issues with abdominal bloating, this seems most prominent in her upper abdomen and feels that it swells after she eats.  Has been going on about a week but overall slightly better over past 2 days.  She denies any nausea/vomiting with onset of bloating.  She denies any abdominal pain or cramping.  Swelling discomfort starts about 30 min after meals. She has not had any worsening GERD symptoms with bloating. She takes a Pepcid each morning which works well, she does take an occasional second Pepcid in the evening if eating spicy greasy foods to prevent GERD sx (less than 1x/month).  She denies any dysphagia.  No longer taking omeprazole medicine list-unsure when stopped.  Reports good appetite, denies significant early satiety w/ symptoms. Feels she is still cleaning her plate.   She takes a stool softener daily and has a bowel movement every other day.  She denies any lower abdominal pain, rectal bleeding or melena. No lower GI sx.   She denies wt changes but has some unintentional wt loss as below. Doesn't feels she has had changes w/ activity level, food intake, etc.   No alcohol/tobacco. Drinks one soda a day. No nsaids (allergic)  Wt Readings from Last 3 Encounters:  05/23/19 137 lb 8 oz (62.4 kg)  10/21/18 145 lb (65.8 kg)  09/08/18 145 lb (65.8 kg)  Jan 2020--#144   Last Colonoscopy: 2018-screening colonoscopy, diverticulosis throughout colon, internal hemorrhoids, no polyps    Last Endoscopy: 2013-Patchy whitish exudate coating mucosa of distal half of the esophagus. Brushing taken for KOH prep. Spontaneous reflux of bile noted into distal esophagus. Small sliding hiatal hernia. Small gastric pouch with patent B1 anastomosis. 7 mm gastric polyp  snared. Path-hyperplastic gastric polyp    Past Medical History:  Past Medical History:  Diagnosis Date  . Erosive esophagitis   . GERD (gastroesophageal reflux disease)   . Hypertension   . Hypertension     Problem List: Patient Active Problem List   Diagnosis Date Noted  . Special screening for malignant neoplasms, colon 03/09/2016  . GERD (gastroesophageal reflux disease) 10/13/2011  . Hypertension 10/13/2011  . Erosive esophagitis 10/13/2011    Past Surgical History: Past Surgical History:  Procedure Laterality Date  . ABDOMINAL HYSTERECTOMY    . ABDOMINAL SURGERY    . COLONOSCOPY N/A 06/16/2016   Procedure: COLONOSCOPY;  Surgeon: Rogene Houston, MD;  [facility];  Service: Endoscopy;  Laterality: N/A;  1030  . ESOPHAGOGASTRODUODENOSCOPY  11/10/2011   Procedure: ESOPHAGOGASTRODUODENOSCOPY (EGD);  Surgeon: Rogene Houston, MD;  [facility];  Service: Endoscopy;  Laterality: N/A;  1:00 pm  . 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 Diclofenac Sodium Rash  . Aspirin Itching and Swelling  . Penicillins Hives, Itching and Swelling    Has patient had a PCN reaction causing immediate rash, facial/tongue/throat swelling, SOB or lightheadedness with hypotension: Yes Has patient had a PCN reaction causing severe rash involving mucus membranes or skin necrosis: Yes Has patient had a PCN reaction that required hospitalization No Has patient had a PCN reaction occurring within the last 10 years: No If all of the above answers  are "NO", then may proceed with Cephalosporin use.       Home Medications:  Current Outpatient Medications:  .  docusate sodium (COLACE) 100 MG capsule, Take 100 mg by mouth daily as needed for mild constipation. , Disp: , Rfl:  .  famotidine (PEPCID) 20 MG tablet, Take 20 mg by mouth daily., Disp: , Rfl:  .  fluticasone (FLONASE) 50 MCG/ACT nasal  spray, Place 1 spray into both nostrils daily., Disp: 16 g, Rfl: 0 .  hydrochlorothiazide (MICROZIDE) 12.5 MG capsule, Take 25 mg by mouth daily. , Disp: , Rfl:  .  losartan (COZAAR) 25 MG tablet, Take 25 mg by mouth daily. , Disp: , Rfl:  .  meclizine (ANTIVERT) 25 MG tablet, Take 1 tablet (25 mg total) by mouth 3 (three) times daily as needed for dizziness., Disp: 30 tablet, Rfl: 0 .  Multiple Vitamin (MULTIVITAMIN WITH MINERALS) TABS tablet, Take 1 tablet by mouth daily., Disp: , Rfl:  .  OLANZapine (ZYPREXA) 5 MG tablet, Take 5 mg by mouth at bedtime., Disp: , Rfl:  .  potassium chloride (KLOR-CON) 20 MEQ packet, Take 20 mEq by mouth daily., Disp: , Rfl:  .  pravastatin (PRAVACHOL) 20 MG tablet, Take 20 mg by mouth at bedtime.  , Disp: , Rfl:  .  omeprazole (PRILOSEC) 20 MG capsule, Take 1 capsule (20 mg total) by mouth daily. (Patient not taking: Reported on 05/23/2019), Disp: 30 capsule, Rfl: 0   Family History: She denies any known family history of colon polyps or colon cancer.   Social History:   reports that she has never smoked. She has quit using smokeless tobacco. She reports that she does not drink alcohol or use drugs. The patient denies ETOH, tobacco, or drug use.   Review of Systems: Constitutional: Denies weight loss/weight gain  Eyes: No changes in vision. ENT: No oral lesions, sore throat.  GI: see HPI.  Heme/Lymph: No easy bruising.  CV: No chest pain.  GU: No hematuria.  Integumentary: No rashes.  Neuro: No headaches.  Psych: No depression/anxiety.  Endocrine: No heat/cold intolerance.  Allergic/Immunologic: No urticaria.  Resp: No cough, SOB.  Musculoskeletal: No joint swelling.    Physical Examination: BP 133/85 (BP Location: Left Arm, Patient Position: Sitting, Cuff Size: Normal)   Pulse 80   Temp (!) 97 F (36.1 C) (Oral)   Ht 5\' 4"  (1.626 m)   Wt 137 lb 8 oz (62.4 kg)   BMI 23.60 kg/m  Gen: NAD, alert and oriented x 4 HEENT: PEERLA, EOMI, Neck:  supple, no JVD Chest: CTA bilaterally, no wheezes, crackles, or other adventitious sounds CV: RRR, no m/g/c/r Abd: soft, NT, ND, +BS in all four quadrants; no HSM, guarding, ridigity, or rebound tenderness Ext: no edema, well perfused with 2+ pulses, Skin: no rash or lesions noted on observed skin Lymph: no noted LAD  Data: Lab Results  Component Value Date   WBC 4.1 09/08/2018   HGB 12.1 09/08/2018   HCT 38.5 09/08/2018   MCV 90.6 09/08/2018   PLT 239 09/08/2018   No results for input(s): HGB in the last 168 hours. Lab Results  Component Value Date   NA 135 09/08/2018   K 3.3 (L) 09/08/2018   CL 101 09/08/2018   CO2 25 09/08/2018   BUN 15 09/08/2018   CREATININE 0.91 09/08/2018   Lab Results  Component Value Date   ALT 14 09/08/2018   AST 20 09/08/2018   ALKPHOS 91 09/08/2018   BILITOT 0.2 (  L) 09/08/2018   No results for input(s): APTT, INR, PTT in the last 168 hours.    No results found.  Assessment/Plan: Candace [name] is a 73 y.o. female    Merrill was seen today for follow-up.  Diagnoses and all orders for this visit:  Loss of weight -     CBC -     COMPLETE METABOLIC PANEL WITH GFR -     Lipase -     TSH  Abdominal distension (gaseous) -     CBC -     COMPLETE METABOLIC PANEL WITH GFR -     Lipase -     TSH -     CT ABDOMEN PELVIS W CONTRAST; Future  Gastroesophageal reflux disease, unspecified whether esophagitis present -     CBC -     COMPLETE METABOLIC PANEL WITH GFR -     Lipase -     TSH    1. 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 Also w/ gastric ulcer 2007.  No nsaid use, etc but consider empiric trial of PPI for gastritis, etc if symptoms persist & work up negative while awaiting endoscopy (she prefers to avoid if possible during covid pandemic).   2. Colon cancer screening -  UTD, due 2028. Denies family hx colon polyps/crc.   3. GERD-asymptomatic w/ diet modifications and pepcid, consider EGD as above. No dysphagia/nausea/vomiting.   4. Constipation - well controlled w/ daily colace, continue.  Will call pt w/ results and follow up plan of care.     I personally performed the service, non-incident to. (WP)  Laurine Blazer, PA-C [facility]  I agree with the above assessment and plan [facility] CRNP

## 2019-05-23 NOTE — Patient Instructions (Signed)
We are checking labs today and a CT scan for further evaluation - we will call w/ results.

## 2019-05-27 ENCOUNTER — Telehealth (INDEPENDENT_AMBULATORY_CARE_PROVIDER_SITE_OTHER): Payer: Self-pay | Admitting: Gastroenterology

## 2019-05-27 DIAGNOSIS — R140 Abdominal distension (gaseous): Secondary | ICD-10-CM

## 2019-05-27 DIAGNOSIS — K219 Gastro-esophageal reflux disease without esophagitis: Secondary | ICD-10-CM

## 2019-05-27 DIAGNOSIS — R634 Abnormal weight loss: Secondary | ICD-10-CM

## 2019-05-27 NOTE — Telephone Encounter (Signed)
CT abdomen pelvis ordered at time of OV declined by insurance with diagnosis codes GERD, weight loss, bloating.  Will transition to order ultrasound abdomen complete and can do CT if unremarkable.

## 2019-05-27 NOTE — Telephone Encounter (Signed)
[place] sch'd 06/06/19 at 930 ([phone]), npo after midnight, left detailed message for patient

## 2019-06-06 ENCOUNTER — Ambulatory Visit (HOSPITAL_COMMUNITY)
Admission: RE | Admit: 2019-06-06 | Discharge: 2019-06-06 | Disposition: A | Discharge status: Home / Self Care | Payer: Medicare HMO | Source: Ambulatory Visit | Attending: Gastroenterology | Admitting: Gastroenterology

## 2019-06-06 ENCOUNTER — Other Ambulatory Visit: Payer: Self-pay

## 2019-06-06 DIAGNOSIS — K219 Gastro-esophageal reflux disease without esophagitis: Secondary | ICD-10-CM | POA: Insufficient documentation

## 2019-06-06 DIAGNOSIS — R140 Abdominal distension (gaseous): Secondary | ICD-10-CM | POA: Diagnosis present

## 2019-06-06 DIAGNOSIS — R634 Abnormal weight loss: Secondary | ICD-10-CM | POA: Insufficient documentation

## 2019-06-06 LAB — COMPLETE METABOLIC PANEL WITH GFR
AG Ratio: 1.2 (calc) (ref 1.0–2.5)
ALT: 13 U/L (ref 6–29)
AST: 17 U/L (ref 10–35)
Albumin: 4.2 g/dL (ref 3.6–5.1)
Alkaline phosphatase (APISO): 93 U/L (ref 37–153)
BUN: 14 mg/dL (ref 7–25)
CO2: 28 mmol/L (ref 20–32)
Calcium: 9.5 mg/dL (ref 8.6–10.4)
Chloride: 103 mmol/L (ref 98–110)
Creat: 0.86 mg/dL (ref 0.60–0.93)
GFR, Est African American: 78 mL/min/{1.73_m2} (ref >=60)
GFR, Est Non African American: 67 mL/min/{1.73_m2} (ref >=60)
Globulin: 3.5 g/dL (calc) (ref 1.9–3.7)
Glucose, Bld: 88 mg/dL (ref 65–139)
Potassium: 4.1 mmol/L (ref 3.5–5.3)
Sodium: 140 mmol/L (ref 135–146)
Total Bilirubin: 0.3 mg/dL (ref 0.2–1.2)
Total Protein: 7.7 g/dL (ref 6.1–8.1)

## 2019-06-06 LAB — CBC
HCT: 34.7 % — ABNORMAL LOW (ref 35.0–45.0)
Hemoglobin: 11.4 g/dL — ABNORMAL LOW (ref 11.7–15.5)
MCH: 29.6 pg (ref 27.0–33.0)
MCHC: 32.9 g/dL (ref 32.0–36.0)
MCV: 90.1 fL (ref 80.0–100.0)
MPV: 10.4 fL (ref 7.5–12.5)
Platelets: 301 10*3/uL (ref 140–400)
RBC: 3.85 10*6/uL (ref 3.80–5.10)
RDW: 12.3 % (ref 11.0–15.0)
WBC: 5.1 10*3/uL (ref 3.8–10.8)

## 2019-06-06 LAB — TSH: TSH: 1.54 mIU/L (ref 0.40–4.50)

## 2019-06-06 LAB — LIPASE: Lipase: 8 U/L (ref 7–60)

## 2019-06-27 DIAGNOSIS — N952 Postmenopausal atrophic vaginitis: Secondary | ICD-10-CM | POA: Insufficient documentation

## 2019-07-02 ENCOUNTER — Encounter (HOSPITAL_COMMUNITY): Payer: Self-pay

## 2019-07-02 ENCOUNTER — Emergency Department (HOSPITAL_COMMUNITY)
Admission: EM | Admit: 2019-07-02 | Discharge: 2019-07-02 | Disposition: A | Discharge status: Home / Self Care | Payer: Medicare HMO | Attending: Emergency Medicine | Admitting: Emergency Medicine

## 2019-07-02 ENCOUNTER — Other Ambulatory Visit: Payer: Self-pay

## 2019-07-02 DIAGNOSIS — Z79899 Other long term (current) drug therapy: Secondary | ICD-10-CM | POA: Diagnosis not present

## 2019-07-02 DIAGNOSIS — I10 Essential (primary) hypertension: Secondary | ICD-10-CM | POA: Diagnosis not present

## 2019-07-02 DIAGNOSIS — J0101 Acute recurrent maxillary sinusitis: Secondary | ICD-10-CM | POA: Diagnosis not present

## 2019-07-02 DIAGNOSIS — R519 Headache, unspecified: Secondary | ICD-10-CM | POA: Diagnosis present

## 2019-07-02 MED ORDER — AZITHROMYCIN 250 MG PO TABS: 250.0000 mg | ORAL_TABLET | Freq: Every day | ORAL | 0 refills | Status: DC

## 2019-07-02 NOTE — ED Triage Notes (Signed)
Pt feels like she has a sinus infection. States she always has trouble and it causes her to be dizzy. NAD. Alert and oriented. Complaining of sinus pressure.

## 2019-07-02 NOTE — ED Provider Notes (Signed)
[facility] Provider Note   CSN: [id number] Arrival date & time: 07/02/19  1348     History Chief Complaint  Patient presents with  . Recurrent Sinusitis    Candace [name] is a 74 y.o. female.  HPI   This patient is a very pleasant 74 year old female, history of hypertension and some recurrent sinusitis.  States that the last time she was treated was approximately 1 year ago.  She now states that over the last several days has had some worsening facial pressure, perinasal pressure, drainage from the front of her nose as well as postnasal drip, a burning in the nose and feels like her eyes are watering.  She has had some drainage, she denies fevers or chills nausea or vomiting, using some nasal sprays which she purchased over-the-counter.  Past Medical History:  Diagnosis Date  . Erosive esophagitis   . GERD (gastroesophageal reflux disease)   . Hypertension   . Hypertension     Patient Active Problem List   Diagnosis Date Noted  . Special screening for malignant neoplasms, colon 03/09/2016  . GERD (gastroesophageal reflux disease) 10/13/2011  . Hypertension 10/13/2011  . Erosive esophagitis 10/13/2011    Past Surgical History:  Procedure Laterality Date  . ABDOMINAL HYSTERECTOMY    . ABDOMINAL SURGERY    . COLONOSCOPY N/A 06/16/2016   Procedure: COLONOSCOPY;  Surgeon: Rogene Houston, MD;  [facility];  Service: Endoscopy;  Laterality: N/A;  1030  . ESOPHAGOGASTRODUODENOSCOPY  11/10/2011   Procedure: ESOPHAGOGASTRODUODENOSCOPY (EGD);  Surgeon: Rogene Houston, MD;  Location: [facility];  Service: Endoscopy;  Laterality: N/A;  1:00 pm  . 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     No family history on file.  Social History   Tobacco Use  . Smoking status: Never Smoker  . Smokeless tobacco: Former Network engineer Use Topics  . Alcohol use:  No  . Drug use: No    Home Medications Prior to Admission medications   Medication Sig Start Date End Date Taking? Authorizing Provider  azithromycin (ZITHROMAX Z-PAK) 250 MG tablet Take 1 tablet (250 mg total) by mouth daily. 500mg  PO day 1, then 250mg  PO days 205 07/02/19   Noemi Chapel, MD  docusate sodium (COLACE) 100 MG capsule Take 100 mg by mouth daily as needed for mild constipation.     [provider]  famotidine (PEPCID) 20 MG tablet Take 20 mg by mouth daily.    [provider]  fluticasone (FLONASE) 50 MCG/ACT nasal spray Place 1 spray into both nostrils daily. 09/08/18   [name], MD  hydrochlorothiazide (MICROZIDE) 12.5 MG capsule Take 25 mg by mouth daily.     [provider]  losartan (COZAAR) 25 MG tablet Take 25 mg by mouth daily.  05/08/19   [provider]  meclizine (ANTIVERT) 25 MG tablet Take 1 tablet (25 mg total) by mouth 3 (three) times daily as needed for dizziness. 09/08/18   [name], MD  Multiple Vitamin (MULTIVITAMIN WITH MINERALS) TABS tablet Take 1 tablet by mouth daily.    [provider]  OLANZapine (ZYPREXA) 5 MG tablet Take 5 mg by mouth at bedtime.    [provider]  omeprazole (PRILOSEC) 20 MG capsule Take 1 capsule (20 mg total) by mouth daily. Patient not taking: Reported on 05/23/2019 04/09/17  [name], PA-C  potassium chloride (KLOR-CON) 20 MEQ packet Take 20 mEq by mouth daily.    [provider]  pravastatin (PRAVACHOL) 20 MG tablet Take 20 mg by mouth at bedtime.      [provider]  divalproex (DEPAKOTE) 250 MG EC tablet Take 250 mg by mouth 2 (two) times daily.    08/29/11  [provider]    Allergies    Diclofenac sodium, Aspirin, and Penicillins  Review of Systems   Review of Systems  Constitutional: Negative for fever.  HENT: Positive for congestion, postnasal drip, rhinorrhea, sinus pressure and sinus pain. Negative for ear pain,  nosebleeds and sore throat.   Respiratory: Negative for cough and shortness of breath.   Cardiovascular: Negative for chest pain.  Gastrointestinal: Negative for nausea and vomiting.    Physical Exam Updated Vital Signs BP (!) 142/84 (BP Location: Left Arm)   Pulse 83   Temp 98.3 F (36.8 C) (Oral)   Resp 14   Ht 1.626 m (5\' 4" )   Wt 62.6 kg   SpO2 100%   BMI 23.69 kg/m   Physical Exam Vitals and nursing note reviewed.  Constitutional:      Appearance: She is well-developed. She is not diaphoretic.  HENT:     Head: Normocephalic and atraumatic.     Comments: Bilateral nares with mildly swollen turbinates, purulent drainage from the bilateral nares, posterior pharynx is totally clear Eyes:     General:        Right eye: No discharge.        Left eye: No discharge.     Conjunctiva/sclera: Conjunctivae normal.  Neck:     Comments: Supple neck with no lymphadenopathy Cardiovascular:     Rate and Rhythm: Normal rate and regular rhythm.     Heart sounds: No murmur.  Pulmonary:     Effort: Pulmonary effort is normal. No respiratory distress.  Skin:    General: Skin is warm and dry.     Findings: No erythema or rash.  Neurological:     Mental Status: She is alert.     Coordination: Coordination normal.     ED Results / Procedures / Treatments   Labs (all labs ordered are listed, but only abnormal results are displayed) Labs Reviewed - No data to display  EKG None  Radiology No results found.  Procedures Procedures (including critical care time)  Medications Ordered in ED Medications - No data to display  ED Course  I have reviewed the triage vital signs and the nursing notes.  Pertinent labs & imaging results that were available during my care of the patient were reviewed by me and considered in my medical decision making (see chart for details).    MDM Rules/Calculators/A&P                      This patient is well-appearing with mild early sinusitis,  possibly viral but given the patient's history and purulent drainage we will treat with an antibiotic, patient stable for discharge and agreeable to the plan.  Final Clinical Impression(s) / ED Diagnoses Final diagnoses:  Acute recurrent maxillary sinusitis    Rx / DC Orders ED Discharge Orders         Ordered    azithromycin (ZITHROMAX Z-PAK) 250 MG tablet  Daily     07/02/19 1427           Noemi Chapel, MD 07/02/19 1428

## 2019-07-02 NOTE — Discharge Instructions (Signed)
Please take Zithromax daily for the next 5 days, Flonase in the morning, Zyrtec 10 mg daily  Seek medical exam for severe or worsening symptoms and follow-up with your doctor in 48 hours for recheck if no better

## 2019-10-14 ENCOUNTER — Ambulatory Visit (INDEPENDENT_AMBULATORY_CARE_PROVIDER_SITE_OTHER): Payer: Medicare HMO | Admitting: Gastroenterology

## 2019-10-28 ENCOUNTER — Ambulatory Visit (INDEPENDENT_AMBULATORY_CARE_PROVIDER_SITE_OTHER): Payer: Medicare HMO | Admitting: Gastroenterology

## 2019-10-28 ENCOUNTER — Encounter (INDEPENDENT_AMBULATORY_CARE_PROVIDER_SITE_OTHER): Payer: Self-pay | Admitting: Gastroenterology

## 2019-10-28 ENCOUNTER — Other Ambulatory Visit: Payer: Self-pay

## 2019-10-28 VITALS — BP 101/67 | HR 74 | Temp 97.0°F | Ht 64.0 in | Wt 137.8 lb

## 2019-10-28 DIAGNOSIS — R634 Abnormal weight loss: Secondary | ICD-10-CM | POA: Diagnosis not present

## 2019-10-28 DIAGNOSIS — K219 Gastro-esophageal reflux disease without esophagitis: Secondary | ICD-10-CM | POA: Diagnosis not present

## 2019-10-28 DIAGNOSIS — R140 Abdominal distension (gaseous): Secondary | ICD-10-CM

## 2019-10-28 NOTE — Patient Instructions (Signed)
Monitor weight weekly if drops down important to contact us. Also please call if develops any abdominal pain, nausea, vomiting, poor appetite or changes w/ bowel movements.

## 2019-10-28 NOTE — Progress Notes (Signed)
Patient profile: Candace [name] is a 74 y.o. female seen for follow-up. Last seen in clinic on December 2020.  At that visit labs were done which were normal.  A CT was ordered for evaluation of weight loss and bloating, insurance ultimately denied CT and ultrasound abdomen complete was ordered instead which was unremarkable.  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  History of Present Illness: Candace [name] is seen today for follow-up of bloating and weight loss.  She reports she has not had any bloating since her December 2020 visit.  She noted specific foods such as oranges, orange juice, fried chicken, tomatoes as triggers of both GERD and the bloating.  She feels her appetite is back to normal.  She denies any GERD symptoms currently as long as she takes Pepcid daily.  No dysphagia.  States she is cleaning her plate.  She has not lost any further weight.  She is typically having bowel movement every other day.  She does continue take a stool softener daily.  She denies any constipation, diarrhea, melena, rectal bleeding.  No abdominal pain  Wt Readings from Last 3 Encounters:  10/28/19 137 lb 12.8 oz (62.5 kg)  07/02/19 138 lb (62.6 kg)  05/23/19 137 lb 8 oz (62.4 kg)     Last Colonoscopy: 2018-screening colonoscopy, diverticulosis throughout colon, internal hemorrhoids, no polyps    Last Endoscopy: 2013-Patchy whitish exudate coating mucosa of distal half of the esophagus. Brushing taken for KOH prep. Spontaneous reflux of bile noted into distal esophagus. Small sliding hiatal hernia. Small gastric pouch with patent B1 anastomosis. 7 mm gastric polyp snared. Path-hyperplastic gastric polyp     Past Medical History:  Past Medical History:  Diagnosis Date  . Erosive esophagitis   . GERD (gastroesophageal reflux disease)   . Hypertension   . Hypertension     Problem List: Patient Active Problem List   Diagnosis Date  Noted  . Special screening for malignant neoplasms, colon 03/09/2016  . GERD (gastroesophageal reflux disease) 10/13/2011  . Hypertension 10/13/2011  . Erosive esophagitis 10/13/2011    Past Surgical History: Past Surgical History:  Procedure Laterality Date  . ABDOMINAL HYSTERECTOMY    . ABDOMINAL SURGERY    . COLONOSCOPY N/A 06/16/2016   Procedure: COLONOSCOPY;  Surgeon: Rogene Houston, MD;  [facility];  Service: Endoscopy;  Laterality: N/A;  1030  . ESOPHAGOGASTRODUODENOSCOPY  11/10/2011   Procedure: ESOPHAGOGASTRODUODENOSCOPY (EGD);  Surgeon: Rogene Houston, MD;  [facility];  Service: Endoscopy;  Laterality: N/A;  1:00 pm  . 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 Diclofenac Sodium Rash  . Aspirin Itching and Swelling  . Penicillins Hives, Itching and Swelling    Has patient had a PCN reaction causing immediate rash, facial/tongue/throat swelling, SOB or lightheadedness with hypotension: Yes Has patient had a PCN reaction causing severe rash involving mucus membranes or skin necrosis: Yes Has patient had a PCN reaction that required hospitalization No Has patient had a PCN reaction occurring within the last 10 years: No If all of the above answers are "NO", then may proceed with Cephalosporin use.       Home Medications:  Current Outpatient Medications:  .  docusate sodium (COLACE) 100 MG capsule, Take 100 mg by mouth daily as needed for mild constipation. , Disp: ,  Rfl:  .  fluticasone (FLONASE) 50 MCG/ACT nasal spray, Place 1 spray into both nostrils daily., Disp: 16 g, Rfl: 0 .  hydrochlorothiazide (MICROZIDE) 12.5 MG capsule, Take 25 mg by mouth daily. , Disp: , Rfl:  .  losartan (COZAAR) 25 MG tablet, Take 25 mg by mouth daily. , Disp: , Rfl:  .  meclizine (ANTIVERT) 25 MG tablet, Take 1 tablet (25 mg total) by mouth 3 (three) times daily as needed for  dizziness., Disp: 30 tablet, Rfl: 0 .  Multiple Vitamin (MULTIVITAMIN WITH MINERALS) TABS tablet, Take 1 tablet by mouth daily., Disp: , Rfl:  .  OLANZapine (ZYPREXA) 5 MG tablet, Take 5 mg by mouth at bedtime., Disp: , Rfl:  .  potassium chloride (KLOR-CON) 20 MEQ packet, Take 20 mEq by mouth daily., Disp: , Rfl:  .  pravastatin (PRAVACHOL) 20 MG tablet, Take 20 mg by mouth at bedtime.  , Disp: , Rfl:  .  famotidine (PEPCID) 20 MG tablet, Take 1 tablet (20 mg total) by mouth daily., Disp: 30 tablet, Rfl: 3   Family History: No family hx colon polyps or colon cancer   Social History:   reports that she has never smoked. She has quit using smokeless tobacco. She reports that she does not drink alcohol or use drugs.   Review of Systems: Constitutional: Denies weight loss/weight gain  Eyes: No changes in vision. ENT: No oral lesions, sore throat.  GI: see HPI.  Heme/Lymph: No easy bruising.  CV: No chest pain.  GU: No hematuria.  Integumentary: No rashes.  Neuro: No headaches.  Psych: No depression/anxiety.  Endocrine: No heat/cold intolerance.  Allergic/Immunologic: No urticaria.  Resp: No cough, SOB.  Musculoskeletal: No joint swelling.    Physical Examination: BP 101/67 (BP Location: Right Arm, Patient Position: Sitting, Cuff Size: Normal)   Pulse 74   Temp (!) 97 F (36.1 C) (Temporal)   Ht 5\' 4"  (1.626 m)   Wt 137 lb 12.8 oz (62.5 kg)   BMI 23.65 kg/m  Gen: NAD, alert and oriented x 4 HEENT: PEERLA, EOMI, Neck: supple, no JVD Chest: CTA bilaterally, no wheezes, crackles, or other adventitious sounds CV: RRR, no m/g/c/r Abd: soft, NT, ND, +BS in all four quadrants; no HSM, guarding, ridigity, or rebound tenderness Ext: no edema, well perfused with 2+ pulses, Skin: no rash or lesions noted on observed skin Lymph: no noted LAD  Data Reviewed:  IMPRESSION: Ultrasound December 2020 Status post cholecystectomy.  No acute abnormality noted.  Nonvisualization of the  left kidney due to poor acoustical window.    05/2019--Hgb 11.4, MCV 90, CMP normal  Assessment/Plan: Ms. Schellhase is a 74 y.o. female   1.  Bloating-has resolved with diet modifications such as avoidance of known trigger foods.  She has not had in the past 6 months and her weight has been stable.  Given normal ultrasound & labs will hold off on further evaluation.  If she has return of symptoms she is to contact me-she has a scale at home and will weigh weekly and contact me w/ any drop in weight.   2.  GERD-well-controlled on Pepcid which she will continue.  Candace [name] was seen today for follow-up.  Diagnoses and all orders for this visit:  Abdominal distension (gaseous)  Loss of weight  Gastroesophageal reflux disease, unspecified whether esophagitis present  Other orders -     famotidine (PEPCID) 20 MG tablet; Take 1 tablet (20 mg total) by mouth daily.     Recommendations:  Follow-up 1 year-call sooner if needed   I personally performed the service, non-incident to. (WP)  Laurine Blazer, [facility]

## 2019-10-29 MED ORDER — FAMOTIDINE 20 MG PO TABS: 20.0000 mg | ORAL_TABLET | Freq: Every day | ORAL | 3 refills | Status: DC

## 2019-11-11 ENCOUNTER — Telehealth (INDEPENDENT_AMBULATORY_CARE_PROVIDER_SITE_OTHER): Payer: Self-pay | Admitting: Gastroenterology

## 2019-11-11 NOTE — Telephone Encounter (Signed)
Patient would like a call back at [phone]

## 2019-11-12 NOTE — Telephone Encounter (Signed)
Returned call. No answer. LMOM for call back.   Mitzie - please find out her concerns/questions if she calls back. Thanks.

## 2019-11-18 ENCOUNTER — Telehealth (INDEPENDENT_AMBULATORY_CARE_PROVIDER_SITE_OTHER): Payer: Self-pay | Admitting: Gastroenterology

## 2019-11-18 NOTE — Telephone Encounter (Signed)
Patient called back stated her phone number today is [phone]

## 2019-11-18 NOTE — Telephone Encounter (Signed)
I discussed symptoms with patient via phone.  I offered her appointments today and Wednesday but she is unable to make either day.  She is having some bloating and constipation, she will try MiraLAX and call back if does not help symptoms.

## 2019-11-18 NOTE — Telephone Encounter (Signed)
Attempted to call patient back again at both numbers in chart - cell went to voicemail and brother answered at home number and states she wasn't home. I also tried to call her back on Friday.   If she calls again may be best to make an OV to discuss her concerns. Thanks!

## 2019-11-18 NOTE — Telephone Encounter (Signed)
Patient would like a call back regarding her stomach issues - ph# [phone]

## 2019-11-30 ENCOUNTER — Other Ambulatory Visit: Payer: Self-pay

## 2019-11-30 ENCOUNTER — Encounter (HOSPITAL_COMMUNITY): Payer: Self-pay | Admitting: Emergency Medicine

## 2019-11-30 DIAGNOSIS — K5900 Constipation, unspecified: Secondary | ICD-10-CM | POA: Diagnosis not present

## 2019-11-30 DIAGNOSIS — R140 Abdominal distension (gaseous): Secondary | ICD-10-CM | POA: Diagnosis present

## 2019-11-30 DIAGNOSIS — Z79899 Other long term (current) drug therapy: Secondary | ICD-10-CM | POA: Insufficient documentation

## 2019-11-30 DIAGNOSIS — I10 Essential (primary) hypertension: Secondary | ICD-10-CM | POA: Insufficient documentation

## 2019-11-30 NOTE — ED Triage Notes (Signed)
Patient states abdominal distention and bloating x 1 week.

## 2019-12-01 ENCOUNTER — Emergency Department (HOSPITAL_COMMUNITY): Payer: Medicare HMO

## 2019-12-01 ENCOUNTER — Emergency Department (HOSPITAL_COMMUNITY)
Admission: EM | Admit: 2019-12-01 | Discharge: 2019-12-01 | Disposition: A | Discharge status: Home / Self Care | Payer: Medicare HMO | Attending: Emergency Medicine | Admitting: Emergency Medicine

## 2019-12-01 DIAGNOSIS — K5900 Constipation, unspecified: Secondary | ICD-10-CM

## 2019-12-01 NOTE — ED Provider Notes (Signed)
[facility] EMERGENCY DEPARTMENT Provider Note   CSN: [id number] Arrival date & time: 11/30/19  2211   Time seen 4:20 AM  History Chief Complaint  Patient presents with  . abdominal distension    Candace [name] is a 74 y.o. female.  HPI Patient reports for the past week whenever she eats she gets a bloated feeling or heaviness in her lower abdomen.  That lasts 10 to 15 minutes then goes away.  She denies nausea, vomiting, or diarrhea.  She has had no fever.  She states she is constipated although she did have a small normal bowel movement earlier today.  She states she has had this before but does not know what was wrong with her but she denies having bowel obstruction.  She states she feels perfectly fine now and in between episodes.  It only happens when she eats.  Patient is status post tumor removal from her stomach in 1995 and a partial hysterectomy.  PCP [facility]     Past Medical History:  Diagnosis Date  . Erosive esophagitis   . GERD (gastroesophageal reflux disease)   . Hypertension   . Hypertension     Patient Active Problem List   Diagnosis Date Noted  . Special screening for malignant neoplasms, colon 03/09/2016  . GERD (gastroesophageal reflux disease) 10/13/2011  . Hypertension 10/13/2011  . Erosive esophagitis 10/13/2011    Past Surgical History:  Procedure Laterality Date  . ABDOMINAL HYSTERECTOMY    . ABDOMINAL SURGERY    . COLONOSCOPY N/A 06/16/2016   Procedure: COLONOSCOPY;  Surgeon: Rogene Houston, MD;  [facility];  Service: Endoscopy;  Laterality: N/A;  1030  . ESOPHAGOGASTRODUODENOSCOPY  11/10/2011   Procedure: ESOPHAGOGASTRODUODENOSCOPY (EGD);  Surgeon: Rogene Houston, MD;  [facility];  Service: Endoscopy;  Laterality: N/A;  1:00 pm  . 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     History reviewed. No  pertinent family history.  Social History   Tobacco Use  . Smoking status: Never Smoker  . Smokeless tobacco: Former Network engineer Use Topics  . Alcohol use: No  . Drug use: No    Home Medications Prior to Admission medications   Medication Sig Start Date End Date Taking? Authorizing Provider  docusate sodium (COLACE) 100 MG capsule Take 100 mg by mouth daily as needed for mild constipation.     [provider]  famotidine (PEPCID) 20 MG tablet Take 1 tablet (20 mg total) by mouth daily. 10/29/19   Minus Liberty, PA-C  fluticasone (FLONASE) 50 MCG/ACT nasal spray Place 1 spray into both nostrils daily. 09/08/18   [name], MD  hydrochlorothiazide (MICROZIDE) 12.5 MG capsule Take 25 mg by mouth daily.     [provider]  losartan (COZAAR) 25 MG tablet Take 25 mg by mouth daily.  05/08/19   [provider]  meclizine (ANTIVERT) 25 MG tablet Take 1 tablet (25 mg total) by mouth 3 (three) times daily as needed for dizziness. 09/08/18   [name], MD  Multiple Vitamin (MULTIVITAMIN WITH MINERALS) TABS tablet Take 1 tablet by mouth daily.    [provider]  OLANZapine (ZYPREXA) 5 MG tablet Take 5 mg by mouth at bedtime.    [provider]  potassium chloride (KLOR-CON) 20 MEQ packet Take 20 mEq by  mouth daily.    [provider]  pravastatin (PRAVACHOL) 20 MG tablet Take 20 mg by mouth at bedtime.      [provider]  divalproex (DEPAKOTE) 250 MG EC tablet Take 250 mg by mouth 2 (two) times daily.    08/29/11  [provider]    Allergies    Diclofenac sodium, Aspirin, and Penicillins  Review of Systems   Review of Systems  All other systems reviewed and are negative.   Physical Exam Updated Vital Signs BP 111/67 (BP Location: Left Arm)   Pulse 67   Temp 98 F (36.7 C) (Oral)   Resp 14   Ht 5\' 4"  (1.626 m)   Wt 62.1 kg   SpO2 100%   BMI 23.52 kg/m   Physical Exam Vitals and nursing note  reviewed.  Constitutional:      Appearance: Normal appearance. She is normal weight.  HENT:     Head: Normocephalic and atraumatic.     Right Ear: External ear normal.     Left Ear: External ear normal.     Nose: Nose normal.     Mouth/Throat:     Mouth: Mucous membranes are moist.  Eyes:     Extraocular Movements: Extraocular movements intact.     Conjunctiva/sclera: Conjunctivae normal.     Pupils: Pupils are equal, round, and reactive to light.  Cardiovascular:     Rate and Rhythm: Normal rate and regular rhythm.     Pulses: Normal pulses.     Heart sounds: No murmur heard.   Pulmonary:     Effort: No respiratory distress.     Breath sounds: Normal breath sounds.  Abdominal:     General: Bowel sounds are normal.     Palpations: Abdomen is soft.     Tenderness: There is no abdominal tenderness.  Musculoskeletal:        General: Normal range of motion.     Cervical back: Normal range of motion.  Skin:    General: Skin is warm and dry.  Neurological:     General: No focal deficit present.     Mental Status: She is alert and oriented to person, place, and time.     Cranial Nerves: No cranial nerve deficit.     Sensory: Sensory deficit:   Psychiatric:        Mood and Affect: Mood normal.        Behavior: Behavior normal.        Thought Content: Thought content normal.     ED Results / Procedures / Treatments   Labs (all labs ordered are listed, but only abnormal results are displayed) Labs Reviewed - No data to display  EKG None  Radiology DG Abd Portable 2 Views  Result Date: 12/01/2019 CLINICAL DATA:  Abdominal bloating and distention for 1 week EXAM: PORTABLE ABDOMEN - 2 VIEW COMPARISON:  Radiograph 02/22/2011, CT 08/29/2011 FINDINGS: Numerous gas-filled loops of small bowel and colon present throughout the abdomen but without frank dilatation or distension to suggest high-grade obstruction. Moderate colonic stool burden. No subdiaphragmatic free air. No  suspicious calcifications over the gallbladder fossa or urinary tract though limited visualization due to overlying bowel gas and debris. Surgical changes noted at the level of the diaphragm and right upper quadrant. Degenerative changes in the spine hips and pelvis. No worrisome osseous abnormalities. IMPRESSION: 1. Numerous gas-filled loops of small bowel and colon throughout the abdomen but without frank dilatation or distension to suggest high-grade obstruction. Moderate colonic  stool burden. Findings could reflect constipation, less likely mild ileus. Electronically Signed   By: Lovena Le M.D.   On: 12/01/2019 03:03    Procedures Procedures (including critical care time)  Medications Ordered in ED Medications - No data to display  ED Course  I have reviewed the triage vital signs and the nursing notes.  Pertinent labs & imaging results that were available during my care of the patient were reviewed by me and considered in my medical decision making (see chart for details).    MDM Rules/Calculators/A&P                          Patient states she feels totally fine now, she has absolutely no tenderness to her abdomen on exam.  We discussed her x-ray result.  I do not feel like she has ileus.  I think she probably is constipated.  However I did discuss with her with her prior surgery she has at risk for having small bowel obstruction.  She was advised to return to the ED if she has constant pain that lasts more than an hour.  She was given instructions for her constipation.    Final Clinical Impression(s) / ED Diagnoses Final diagnoses:  Constipation, unspecified constipation type    Rx / DC Orders ED Discharge Orders    None      Plan discharge  Rolland Porter, MD, Barbette Or, MD 12/01/19 [phone]

## 2019-12-01 NOTE — Discharge Instructions (Addendum)
Drink plenty of fluids so you do not get dehydrated.  You can take Dulcolax tablets orally and use Dulcolax suppositories at night for relief.  If that does not work you can drink a bottle of milk of magnesia.  If that does not relieve your symptoms, get miralax and put one dose or 17 g in 8 ounces of water,  take 1 dose every 30 minutes for 2-3 hours or until you  get good results and then once or twice daily to prevent constipation.

## 2019-12-10 ENCOUNTER — Telehealth (INDEPENDENT_AMBULATORY_CARE_PROVIDER_SITE_OTHER): Payer: Self-pay | Admitting: Gastroenterology

## 2019-12-10 NOTE — Telephone Encounter (Signed)
Patient left voice mail message stating she is still having stomach issues - please advise - [phone]

## 2019-12-10 NOTE — Telephone Encounter (Signed)
Please notify patient I looked at her x-ray from her ER visit last week and looks like she had constipation.  I would recommend starting MiraLAX daily so she can have a bowel movement daily.  If she continues have abdominal pain or bloating with the miralax she should contact our office.

## 2019-12-11 NOTE — Telephone Encounter (Signed)
Patient was called. I was told that she was not there. Return call 12/12/2019.

## 2019-12-12 NOTE — Telephone Encounter (Signed)
Patient was called and a message was left asking that she call our office back.

## 2019-12-16 NOTE — Telephone Encounter (Signed)
Patient left voice mail message stating she is still having stomach issues - she will be at this number until 5:00  - [phone]

## 2019-12-23 NOTE — Telephone Encounter (Signed)
I called patient at number in chart. She was not home. Left message for her to return call.

## 2020-01-16 ENCOUNTER — Other Ambulatory Visit: Payer: Self-pay

## 2020-01-16 ENCOUNTER — Encounter (INDEPENDENT_AMBULATORY_CARE_PROVIDER_SITE_OTHER): Payer: Self-pay | Admitting: Gastroenterology

## 2020-01-16 ENCOUNTER — Ambulatory Visit (INDEPENDENT_AMBULATORY_CARE_PROVIDER_SITE_OTHER): Payer: Medicare HMO | Admitting: Gastroenterology

## 2020-01-16 VITALS — BP 132/81 | HR 70 | Temp 98.6°F | Ht 64.0 in | Wt 135.0 lb

## 2020-01-16 DIAGNOSIS — K5909 Other constipation: Secondary | ICD-10-CM

## 2020-01-16 DIAGNOSIS — K219 Gastro-esophageal reflux disease without esophagitis: Secondary | ICD-10-CM | POA: Diagnosis not present

## 2020-01-16 MED ORDER — FAMOTIDINE 20 MG PO TABS: 20.0000 mg | ORAL_TABLET | Freq: Every day | ORAL | 11 refills | Status: DC

## 2020-01-16 NOTE — Progress Notes (Signed)
Patient profile: Candace [name] is a 74 y.o. female seen for follow up. Last seen 10/2019. She was seen in the ED on 12/01/19 w/ xray showing constipation. Here today to discuss.    History of Present Illness: Candace [name] is seen today for f/up. Reports her abd pain has resolved since starting combo of miralax 1x/week and colace daily. No longer having abd pain. Usually has BM every other day. No rectal bleeding or melena. Gas issue discussed at her last OV has improved.   She avoids tomatoes and orange juice as these aggrevate GERD symptoms, generally does well if avoids though. Takes famotidine each morning. Denies any nausea/vomiting, GERD, dysphagia. Previously had lost weight last year but has been stable recently.    Wt Readings from Last 3 Encounters:  01/16/20 135 lb (61.2 kg)  11/30/19 137 lb (62.1 kg)  10/28/19 137 lb 12.8 oz (62.5 kg)   05/23/19 137 lb 8 oz (62.4 kg)  10/21/18 145 lb (65.8 kg)  09/08/18 145 lb (65.8 kg)  Jan 2020--#144   Last Colonoscopy:2018-screening colonoscopy, diverticulosis throughout colon, internal hemorrhoids, no polyps   Last Endoscopy:2013-Patchy whitish exudate coating mucosa of distal half of the esophagus. Brushing taken for KOH prep. Spontaneous reflux of bile noted into distal esophagus. Small sliding hiatal hernia. Small gastric pouch with patent B1 anastomosis. 7 mm gastric polyp snared. Path-hyperplastic gastric polyp   Past Medical History:  Past Medical History:  Diagnosis Date  . Erosive esophagitis   . GERD (gastroesophageal reflux disease)   . Hypertension   . Hypertension     Problem List: Patient Active Problem List   Diagnosis Date Noted  . Special screening for malignant neoplasms, colon 03/09/2016  . GERD (gastroesophageal reflux disease) 10/13/2011  . Hypertension 10/13/2011  . Erosive esophagitis 10/13/2011    Past Surgical History: Past Surgical History:  Procedure Laterality Date  . ABDOMINAL  HYSTERECTOMY    . ABDOMINAL SURGERY    . COLONOSCOPY N/A 06/16/2016   Procedure: COLONOSCOPY;  Surgeon: Candace Houston, MD;  [facility]: [facility];  Service: Endoscopy;  Laterality: N/A;  1030  . ESOPHAGOGASTRODUODENOSCOPY  11/10/2011   Procedure: ESOPHAGOGASTRODUODENOSCOPY (EGD);  Surgeon: Candace Houston, MD;  Location: [facility];  Service: Endoscopy;  Laterality: N/A;  1:00 pm  . 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 Diclofenac Sodium Rash  . Aspirin Itching and Swelling  . Penicillins Hives, Itching and Swelling    Has patient had a PCN reaction causing immediate rash, facial/tongue/throat swelling, SOB or lightheadedness with hypotension: Yes Has patient had a PCN reaction causing severe rash involving mucus membranes or skin necrosis: Yes Has patient had a PCN reaction that required hospitalization No Has patient had a PCN reaction occurring within the last 10 years: No If all of the above answers are "NO", then may proceed with Cephalosporin use.       Home Medications:  Current Outpatient Medications:  .  docusate sodium (COLACE) 100 MG capsule, Take 100 mg by mouth daily as needed for mild constipation. , Disp: , Rfl:  .  famotidine (PEPCID) 20 MG tablet, Take 1 tablet (20 mg total) by mouth daily., Disp: 30 tablet, Rfl: 11 .  fluticasone (FLONASE) 50 MCG/ACT nasal spray, Place 1 spray into both nostrils daily., Disp: 16 g, Rfl: 0 .  hydrochlorothiazide (MICROZIDE) 12.5 MG capsule, Take 25 mg  by mouth daily. , Disp: , Rfl:  .  losartan (COZAAR) 25 MG tablet, Take 25 mg by mouth daily. , Disp: , Rfl:  .  Multiple Vitamin (MULTIVITAMIN WITH MINERALS) TABS tablet, Take 1 tablet by mouth daily., Disp: , Rfl:  .  OLANZapine (ZYPREXA) 5 MG tablet, Take 5 mg by mouth at bedtime., Disp: , Rfl:  .  potassium chloride (KLOR-CON) 20 MEQ packet, Take 20 mEq by mouth daily., Disp: , Rfl:  .   pravastatin (PRAVACHOL) 20 MG tablet, Take 20 mg by mouth at bedtime.  , Disp: , Rfl:  .  meclizine (ANTIVERT) 25 MG tablet, Take 1 tablet (25 mg total) by mouth 3 (three) times daily as needed for dizziness. (Patient not taking: Reported on 01/16/2020), Disp: 30 tablet, Rfl: 0   Family History:    No family hx colon polyps or colon cancer   Social History:   reports that she has never smoked. She has quit using smokeless tobacco. She reports that she does not drink alcohol and does not use drugs.   Review of Systems: Constitutional: Denies weight loss/weight gain  Eyes: No changes in vision. ENT: No oral lesions, sore throat.  GI: see HPI.  Heme/Lymph: No easy bruising.  CV: No chest pain.  GU: No hematuria.  Integumentary: No rashes.  Neuro: No headaches.  Psych: No depression/anxiety.  Endocrine: No heat/cold intolerance.  Allergic/Immunologic: No urticaria.  Resp: No cough, SOB.  Musculoskeletal: No joint swelling.    Physical Examination: BP 132/81 (BP Location: Left Arm, Patient Position: Sitting, Cuff Size: Normal)   Pulse 70   Temp 98.6 F (37 C) (Oral)   Ht 5\' 4"  (1.626 m)   Wt 135 lb (61.2 kg)   BMI 23.17 kg/m  Gen: NAD, alert and oriented x 4 HEENT: PEERLA, EOMI, Neck: supple, no JVD Chest: CTA bilaterally, no wheezes, crackles, or other adventitious sounds CV: RRR, no m/g/c/r Abd: soft, NT, ND, +BS in all four quadrants; no HSM, guarding, ridigity, or rebound tenderness Ext: no edema, well perfused with 2+ pulses, Skin: no rash or lesions noted on observed skin Lymph: no noted LAD  Data Reviewed:  Abd Xray - 1. Numerous gas-filled loops of small bowel and colon throughout the abdomen but without frank dilatation or distension to suggest high-grade obstruction. Moderate colonic stool burden. Findings could reflect constipation, less likely mild ileus.  05/2019 labs reviewed   Assessment/Plan: Candace [name] is a 74 y.o. female seen for ER f/up.   1.  Constipation - shown on xray as above. Improved w/ miralax and colace. Now moving stools well. Continue current regimen, reviewed can increase/decrease miralax dose as needed. UTD on colonoscopy (last 2018). Will notify me if worsens   2. GERD - continue diet avoidance and pepcid, given symptoms well controlled hold off on PPI. No UGI alarm symptoms.     She will f/up in ofice in 1 year - she is to contact me if develops any further weight loss but she is currently doing well.   Diagnoses and all orders for this visit:  Gastroesophageal reflux disease without esophagitis  Chronic constipation  Other orders -     famotidine (PEPCID) 20 MG tablet; Take 1 tablet (20 mg total) by mouth daily.    I personally performed the service, non-incident to. (WP)  Laurine Blazer, [facility]

## 2020-01-16 NOTE — Patient Instructions (Signed)
Continue the miralax as needed as discussed Continue famotidine in the morning  Phazyme is over the counter - you can try bloating as needed if symptoms return.

## 2020-02-06 ENCOUNTER — Ambulatory Visit (INDEPENDENT_AMBULATORY_CARE_PROVIDER_SITE_OTHER): Payer: Medicare HMO | Admitting: Gastroenterology

## 2020-06-24 ENCOUNTER — Telehealth (INDEPENDENT_AMBULATORY_CARE_PROVIDER_SITE_OTHER): Payer: Self-pay | Admitting: Gastroenterology

## 2020-06-24 NOTE — Telephone Encounter (Signed)
Patient aware of all.

## 2020-06-24 NOTE — Telephone Encounter (Signed)
I spoke with the patient and she states she has been bloated and has had some constipation for the last few days. She has not had any nausea, or vomiting. She is unsure if she takes a stool softener as she states she is not in front her her medications currently. She would like to know if you have any suggestions. Please advise.

## 2020-06-24 NOTE — Telephone Encounter (Signed)
Patient called stated she is having a lot of bloating - wants to know if the doctor has any suggestions - please advise - ph# [phone]

## 2020-06-24 NOTE — Telephone Encounter (Signed)
Call to the phone listed in file, patient did not pick up the phone.  Left a voice message.  The patient was previously taking MiraLAX once a week and Colace on a daily basis based on the most recent note from clinic.  I advised her to continue with the Colace daily and to increase the MiraLAX to daily dosing 1 cap a day.  She will need to follow-up in the clinic to readdress the symptoms.

## 2020-06-30 NOTE — Telephone Encounter (Signed)
I spoke with the patient and advised her to continue with the colace daily and increase the Miralax to one cap a day. She states she will do this and if no better will call back for an appt.

## 2020-06-30 NOTE — Telephone Encounter (Signed)
Patient left voice mail message stating she is still having abdominal pain - please advise - ph# [phone]

## 2020-10-28 ENCOUNTER — Ambulatory Visit (INDEPENDENT_AMBULATORY_CARE_PROVIDER_SITE_OTHER): Payer: Medicare HMO | Admitting: Gastroenterology

## 2021-01-14 ENCOUNTER — Ambulatory Visit (INDEPENDENT_AMBULATORY_CARE_PROVIDER_SITE_OTHER): Payer: Medicare HMO | Admitting: Gastroenterology

## 2021-01-14 ENCOUNTER — Other Ambulatory Visit: Payer: Self-pay

## 2021-01-14 ENCOUNTER — Encounter (INDEPENDENT_AMBULATORY_CARE_PROVIDER_SITE_OTHER): Payer: Self-pay | Admitting: Gastroenterology

## 2021-01-14 VITALS — BP 112/74 | HR 87 | Temp 99.4°F | Ht 64.0 in | Wt 129.0 lb

## 2021-01-14 DIAGNOSIS — K5900 Constipation, unspecified: Secondary | ICD-10-CM | POA: Insufficient documentation

## 2021-01-14 DIAGNOSIS — K219 Gastro-esophageal reflux disease without esophagitis: Secondary | ICD-10-CM | POA: Diagnosis not present

## 2021-01-14 MED ORDER — FAMOTIDINE 20 MG PO TABS: 20.0000 mg | ORAL_TABLET | Freq: Every day | ORAL | 3 refills | Status: DC

## 2021-01-14 NOTE — Patient Instructions (Addendum)
Continue pepcid '20mg'$  once daily Continue colace daily and miralax as needed for constipation You can do ensure or boost protein shakes to help with added nutrition, 2-3x per day Please let us know if you start to notice more significant weight loss.   Follow up in 1 year

## 2021-01-14 NOTE — Progress Notes (Signed)
Referring Provider: [facility] Primary Care Physician:  [facility] Primary GI Physician: Jenetta Downer  Chief Complaint  Patient presents with   Gastroesophageal Reflux    One year follow up on gerd. Pt states no concerns today. Has one stool every other day, appetite is very good she states, has gas, bloating and heartburn.    HPI:   Candace [name] is a 75 y.o. female with pmh of HTN, GERD, constipation, and erosive esophagitis. Patient is here for follow up of GERD and constipation.   GERD: no issues with reflux currently. patient reports she avoids spicy foods and orange juice. She is currently taking pepcid daily in the morning which she feels is managing her symptoms well. She denies dysphagia or odynophagia.   Constipation:  taking miralax as needed which is rare, and does colace once daily. States she is having a BM every other day. She denies any abdominal pain. Denies bloating or gas. No episodes of diarrhea. Denies melena or rectal bleeding.   She has had about 6 pounds of weight loss since last visit. States she tries to eat at least 2 good meals a day with snacks in between meals. She does a lot of walking for her job and states that she typically eats less in the summer because it is so hot and she just does not have a big appetite but denies early satiety.   Last Colonoscopy:(2018)diverticulosis throughout colon, internal hemorrhoids, no polyps  Last Endoscopy: (2013) patchy whitish exudate coating mucosa of distal half of the esophagus (confirmed candida). Spontaneous reflux of bile noted in distal esophagus. Small sliding HH. Small gastric pouch with patent B1 anastomosis, 12m gastric polyp (hyperplastic gastric polyp).   Recommendations:  No further colonoscopy recommended given patient's advanced age, unless otherwise clinically indicated  No family history of CRC or liver disease. No history of tobacco or alcohol use.   Past  Medical History:  Diagnosis Date   Erosive esophagitis    GERD (gastroesophageal reflux disease)    Hypertension    Hypertension     Past Surgical History:  Procedure Laterality Date   ABDOMINAL HYSTERECTOMY     ABDOMINAL SURGERY     COLONOSCOPY N/A 06/16/2016   Procedure: COLONOSCOPY;  Surgeon: NRogene Houston MD;  [facility];  Service: Endoscopy;  Laterality: N/A;  1030   ESOPHAGOGASTRODUODENOSCOPY  11/10/2011   Procedure: ESOPHAGOGASTRODUODENOSCOPY (EGD);  Surgeon: NRogene Houston MD;  [facility];  Service: Endoscopy;  Laterality: N/A;  1:00 pm   [place]       Current Outpatient Medications  Medication Sig Dispense Refill   docusate sodium (COLACE) 100 MG capsule Take 100 mg by mouth daily as needed for mild constipation.      famotidine (PEPCID) 20 MG tablet Take 1 tablet (20 mg total) by mouth daily. 30 tablet 11   fluticasone (FLONASE) 50 MCG/ACT nasal spray Place 1 spray into both nostrils daily. 16 g 0   hydrochlorothiazide (MICROZIDE) 12.5 MG capsule Take 25 mg by mouth daily.      losartan (COZAAR) 25 MG tablet Take 25 mg by mouth daily.      meclizine (ANTIVERT) 25 MG tablet Take 1 tablet (25 mg total) by mouth 3 (three) times daily as needed for dizziness. 30 tablet 0   Multiple Vitamin (MULTIVITAMIN WITH MINERALS) TABS tablet Take 1 tablet by mouth  daily.     OLANZapine (ZYPREXA) 5 MG tablet Take 5 mg by mouth at bedtime.     potassium chloride (KLOR-CON) 20 MEQ packet Take 20 mEq by mouth daily.     pravastatin (PRAVACHOL) 20 MG tablet Take 20 mg by mouth at bedtime.       No current facility-administered medications for this visit.    Allergies as of 01/14/2021 - Review Complete 01/14/2021  Allergen Reaction Noted   Diclofenac sodium Rash 07/31/2013   Aspirin Itching and Swelling 02/21/2011   Penicillins Hives, Itching, and Swelling 02/21/2011    No family history on  file.  Social History   Socioeconomic History   Marital status: Single    Spouse name: Not on file   Number of children: Not on file   Years of education: Not on file   Highest education level: Not on file  Occupational History   Not on file  Tobacco Use   Smoking status: Never   Smokeless tobacco: Former  Substance and Sexual Activity   Alcohol use: No   Drug use: No   Sexual activity: Not on file  Other Topics Concern   Not on file  Social History Narrative   Not on file   Social Determinants of Health   Financial Resource Strain: Not on file  Food Insecurity: Not on file  Transportation Needs: Not on file  Physical Activity: Not on file  Stress: Not on file  Social Connections: Not on file    Review of Systems: Gen: Denies fever, chills, anorexia. Denies fatigue, weakness. 6 pound weight loss.  CV: Denies chest pain, palpitations, syncope, peripheral edema, and claudication. Resp: Denies dyspnea at rest, cough, wheezing, coughing up blood, and pleurisy. GI: Denies vomiting blood, jaundice, and fecal incontinence. Denies dysphagia or odynophagia. Derm: Denies rash, itching, dry skin Psych: Denies depression, anxiety, memory loss, confusion. No homicidal or suicidal ideation.  Heme: Denies bruising, bleeding, and enlarged lymph nodes.  Physical Exam: BP 112/74 (BP Location: Right Arm, Patient Position: Sitting, Cuff Size: Large)   Pulse 87   Temp 99.4 F (37.4 C) (Oral)   Ht '5\' 4"'$  (1.626 m)   Wt 129 lb (58.5 kg)   BMI 22.14 kg/m  General:   Alert and oriented. No distress noted. Pleasant and cooperative.  Head:  Normocephalic and atraumatic. Eyes:  Conjuctiva clear without scleral icterus. Mouth:  Oral mucosa pink and moist. Good dentition. No lesions. Heart: Normal rate and rhythm, s1 and s2 heart sounds present.  Lungs: Clear lung sounds in all lobes. Respirations equal and unlabored. Abdomen:  +BS, soft, non-tender and non-distended. No rebound or  guarding. No HSM or masses noted. Derm: No palmar erythema or jaundice Msk:  Symmetrical without gross deformities. Normal posture. Extremities:  Without edema. Neurologic:  Alert and  oriented x4 Psych:  Alert and cooperative. Normal mood and affect.  ASSESSMENT: Candace [name] is a 75 y.o. female presenting today for follow up of GERD and Constipation.  Patient is doing well over all. GERD is well managed with pepcid '20mg'$  once daily. Patient reports not breakthrough reflux symptoms.  She is having BMs every other day, taking colace daily with good result. She uses miralax as needed, and very rarely if she does feel constipated. Will continue with current regimen.   6 pound weight loss over the past year, however, patient states she walks a lot for her job and does not eat as much in the summer due to it being so hot.  Does two good meals a day and a snack in between. Encouraged to try 2-3 boost/ ensure protein shakes per day to help with nutrition. Patient has no red flag symptoms and most recent colonoscopy in 2018 was normal. Low concern for any underlying etiology of weight loss. Patient to let us know if she starts to notice more weight loss or any red flag symptoms as this will warrant further evaluation.  Follow Up: 1 year  Case discussed with Dr. Jenetta Downer who is in agreement with plan of care, as outlined above.   Christine Morton L. Alver Sorrow, MSN, APRN, AGNP-C Adult-Gerontology Nurse Practitioner [facility]

## 2021-08-10 IMAGING — US US ABDOMEN COMPLETE
1 series · 14 of 25 positions shown · non-contrast
Comparison: None.

CLINICAL DATA: Abdominal fullness for 1 month

EXAM:
ABDOMEN ULTRASOUND COMPLETE

[Series 1: us abdomen complete · 0.22mm/px · 14 of 139 slices shown]
[im 1/139]
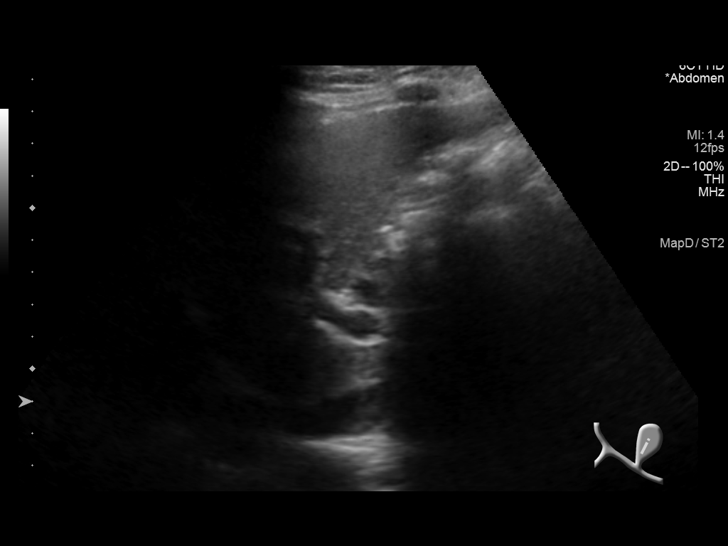
[im 12/139]
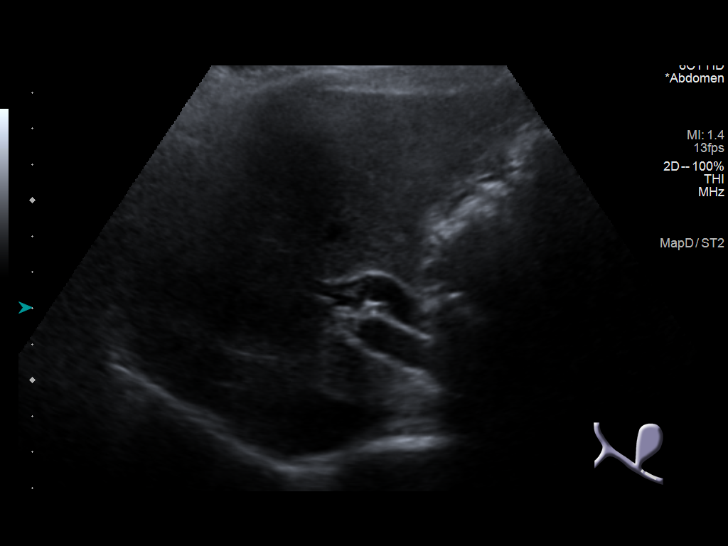
[im 24/139]
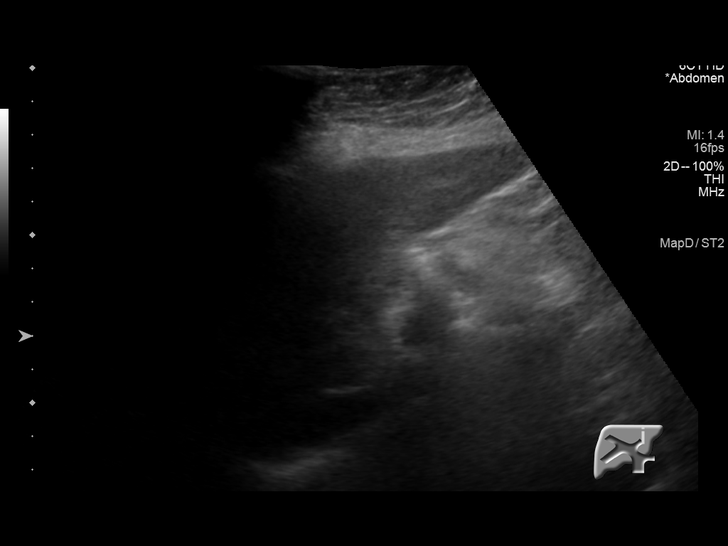
[im 35/139]
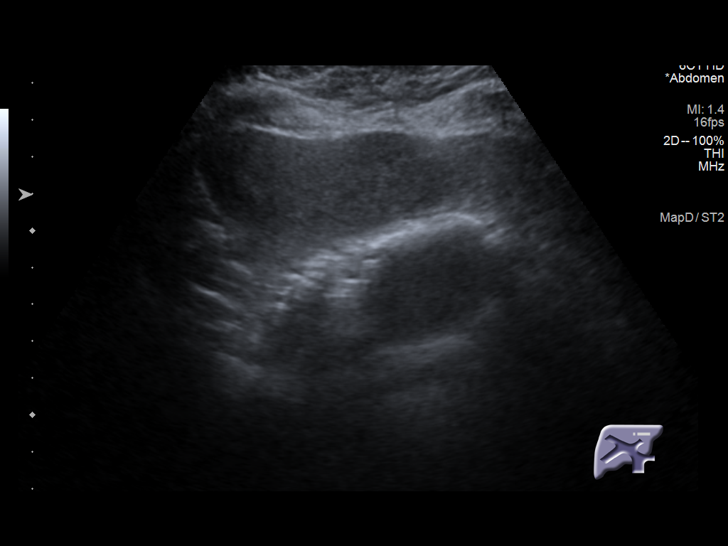
[im 47/139]
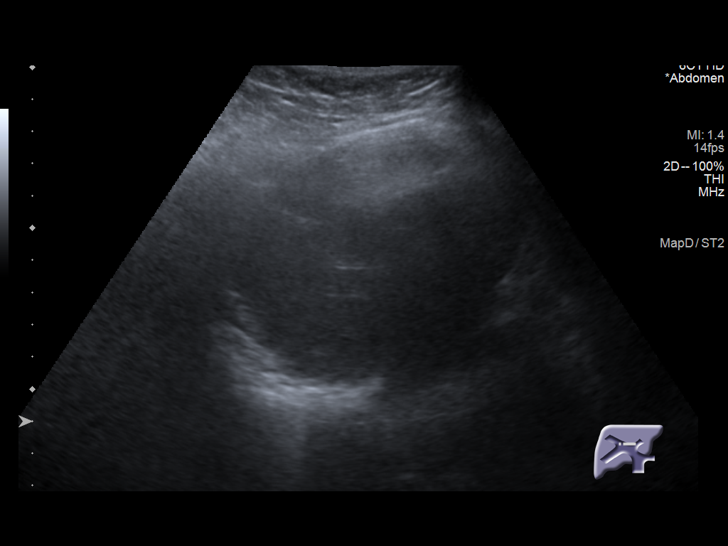
[im 52/139]
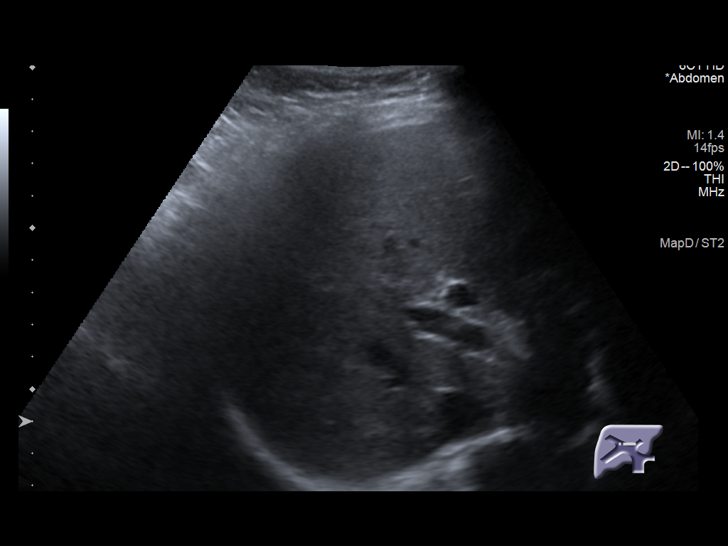
[im 64/139]
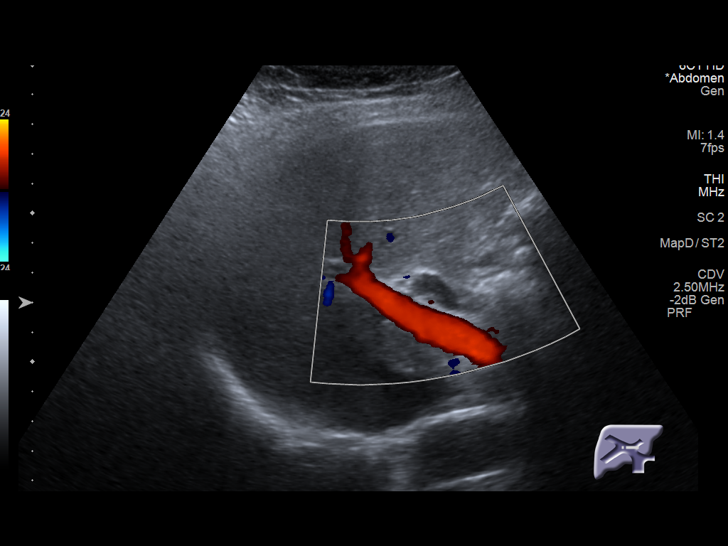
[im 75/139]
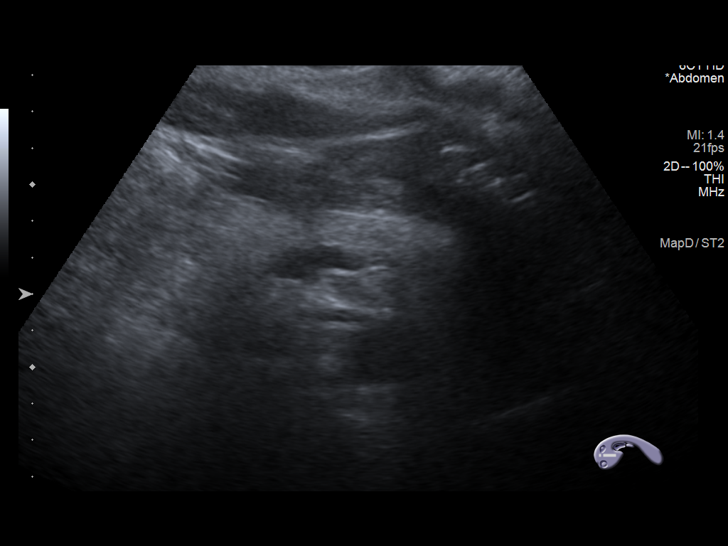
[im 87/139]
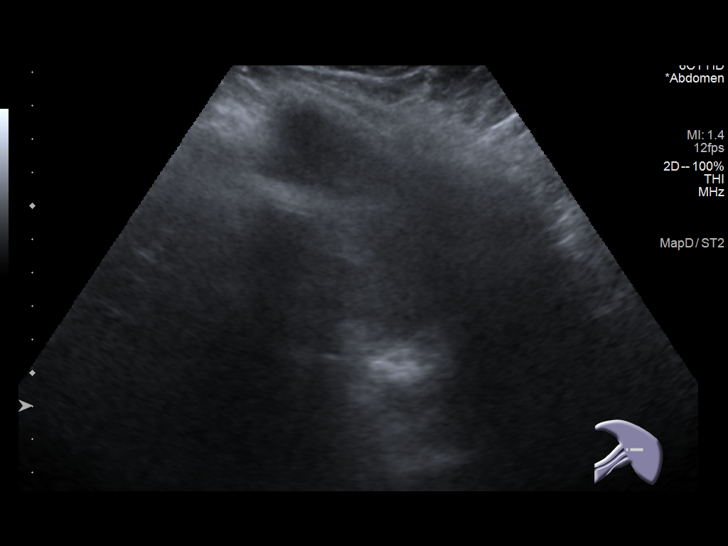
[im 93/139]
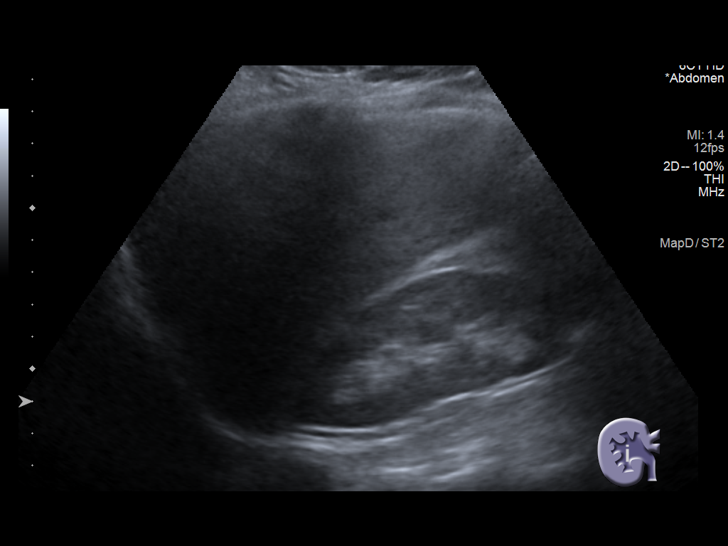
[im 104/139]
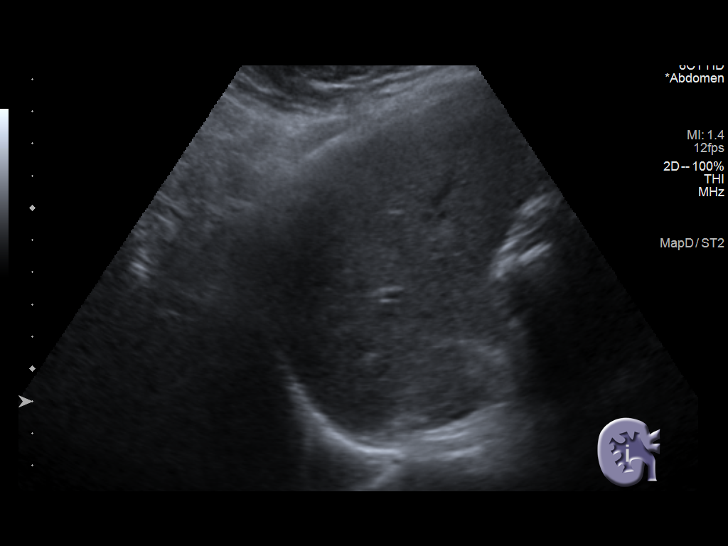
[im 116/139]
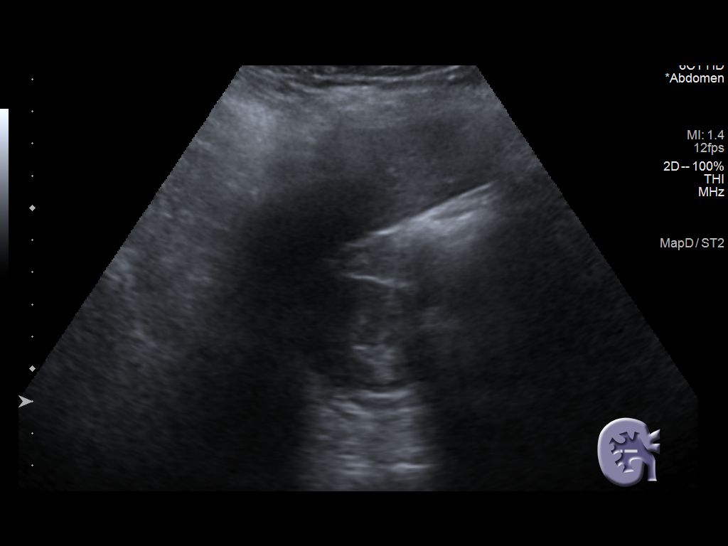
[im 127/139]
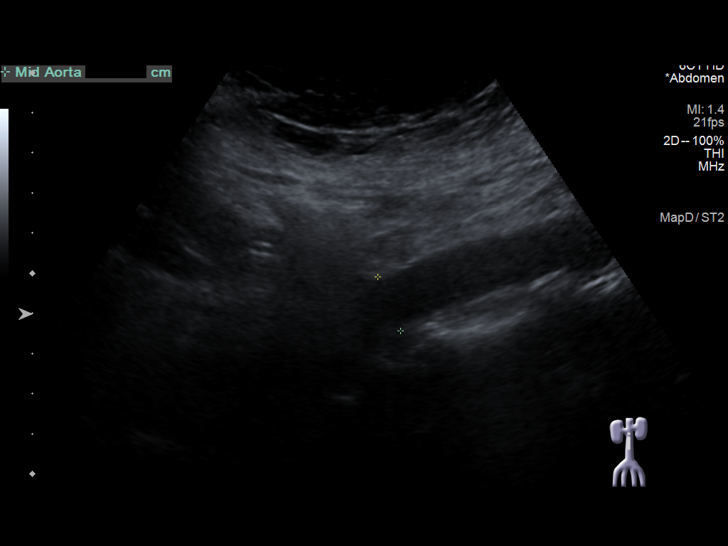
[im 139/139]
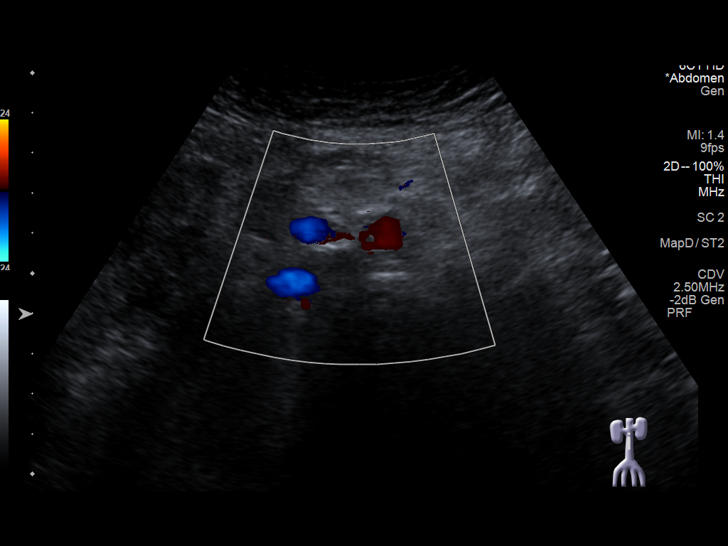

[14 of 25 positions shown; findings below may reference images not displayed]

FINDINGS: Gallbladder: Surgically

Common bile duct: Diameter: 8.6 mm consistent with the post
cholecystectomy state.

Liver: No focal lesion identified. Within normal limits in
parenchymal echogenicity. Portal vein is patent on color Doppler
imaging with normal direction of blood flow towards the liver.

IVC: No abnormality visualized.

Pancreas: Visualized portion unremarkable.

Spleen: Size and appearance within normal limits.

Right Kidney: Length: 9.4 cm. Echogenicity within normal limits. No
mass or hydronephrosis visualized.

Left Kidney: Not well visualized

Abdominal aorta: No aneurysm visualized.

Other findings: None.
IMPRESSION: Status post cholecystectomy.  No acute abnormality noted.

Nonvisualization of the left kidney due to poor acoustical window.

## 2021-10-29 ENCOUNTER — Other Ambulatory Visit (INDEPENDENT_AMBULATORY_CARE_PROVIDER_SITE_OTHER): Payer: Self-pay | Admitting: Gastroenterology

## 2021-10-29 NOTE — Telephone Encounter (Signed)
Last seen 01/14/21 for GERD

## 2021-11-04 ENCOUNTER — Telehealth (INDEPENDENT_AMBULATORY_CARE_PROVIDER_SITE_OTHER): Payer: Self-pay | Admitting: *Deleted

## 2021-11-04 NOTE — Telephone Encounter (Signed)
Pt left message that she was wanting something for gas, bloating and constipation. I called patient back on number left on message [phone] to get more information.

## 2021-11-05 NOTE — Telephone Encounter (Signed)
Left message to return call 

## 2021-11-06 ENCOUNTER — Emergency Department (HOSPITAL_COMMUNITY)
Admission: EM | Admit: 2021-11-06 | Discharge: 2021-11-06 | Disposition: A | Discharge status: Home / Self Care | Payer: Medicare HMO | Attending: Emergency Medicine | Admitting: Emergency Medicine

## 2021-11-06 ENCOUNTER — Encounter (HOSPITAL_COMMUNITY): Payer: Self-pay | Admitting: Emergency Medicine

## 2021-11-06 ENCOUNTER — Emergency Department (HOSPITAL_COMMUNITY): Payer: Medicare HMO

## 2021-11-06 ENCOUNTER — Other Ambulatory Visit: Payer: Self-pay

## 2021-11-06 DIAGNOSIS — Z8589 Personal history of malignant neoplasm of other organs and systems: Secondary | ICD-10-CM | POA: Diagnosis not present

## 2021-11-06 DIAGNOSIS — K5909 Other constipation: Secondary | ICD-10-CM | POA: Insufficient documentation

## 2021-11-06 DIAGNOSIS — I10 Essential (primary) hypertension: Secondary | ICD-10-CM | POA: Diagnosis not present

## 2021-11-06 DIAGNOSIS — D649 Anemia, unspecified: Secondary | ICD-10-CM | POA: Insufficient documentation

## 2021-11-06 DIAGNOSIS — K5900 Constipation, unspecified: Secondary | ICD-10-CM | POA: Diagnosis present

## 2021-11-06 DIAGNOSIS — Z79899 Other long term (current) drug therapy: Secondary | ICD-10-CM | POA: Diagnosis not present

## 2021-11-06 HISTORY — DX: Diverticulosis of intestine, part unspecified, without perforation or abscess without bleeding: K57.90

## 2021-11-06 LAB — COMPREHENSIVE METABOLIC PANEL
ALT: 17 U/L (ref 0–44)
AST: 20 U/L (ref 15–41)
Albumin: 3.5 g/dL (ref 3.5–5.0)
Alkaline Phosphatase: 87 U/L (ref 38–126)
Anion gap: 5 (ref 5–15)
BUN: 15 mg/dL (ref 8–23)
CO2: 27 mmol/L (ref 22–32)
Calcium: 8.6 mg/dL — ABNORMAL LOW (ref 8.9–10.3)
Chloride: 104 mmol/L (ref 98–111)
Creatinine, Ser: 0.87 mg/dL (ref 0.44–1.00)
GFR, Estimated: >60 mL/min (ref >60)
Glucose, Bld: 108 mg/dL — ABNORMAL HIGH (ref 70–99)
Potassium: 3.2 mmol/L — ABNORMAL LOW (ref 3.5–5.1)
Sodium: 136 mmol/L (ref 135–145)
Total Bilirubin: 0.4 mg/dL (ref 0.3–1.2)
Total Protein: 6.9 g/dL (ref 6.5–8.1)

## 2021-11-06 LAB — CBC WITH DIFFERENTIAL/PLATELET
Abs Immature Granulocytes: 0.01 10*3/uL (ref 0.00–0.07)
Basophils Absolute: 0 10*3/uL (ref 0.0–0.1)
Basophils Relative: 1 %
Eosinophils Absolute: 0 10*3/uL (ref 0.0–0.5)
Eosinophils Relative: 1 %
HCT: 33.1 % — ABNORMAL LOW (ref 36.0–46.0)
Hemoglobin: 10.6 g/dL — ABNORMAL LOW (ref 12.0–15.0)
Immature Granulocytes: 0 %
Lymphocytes Relative: 18 %
Lymphs Abs: 1.2 10*3/uL (ref 0.7–4.0)
MCH: 29.7 pg (ref 26.0–34.0)
MCHC: 32 g/dL (ref 30.0–36.0)
MCV: 92.7 fL (ref 80.0–100.0)
Monocytes Absolute: 1 10*3/uL (ref 0.1–1.0)
Monocytes Relative: 16 %
Neutro Abs: 4.3 10*3/uL (ref 1.7–7.7)
Neutrophils Relative %: 64 %
Platelets: 247 10*3/uL (ref 150–400)
RBC: 3.57 MIL/uL — ABNORMAL LOW (ref 3.87–5.11)
RDW: 12.4 % (ref 11.5–15.5)
WBC: 6.5 10*3/uL (ref 4.0–10.5)
nRBC: 0 % (ref 0.0–0.2)

## 2021-11-06 LAB — LIPASE, BLOOD: Lipase: 18 U/L (ref 11–51)

## 2021-11-06 NOTE — Discharge Instructions (Signed)
Thank you for allowing [place] to treat you in the emergency department today.  After reviewing your examination and potential testing that was done it appears that you are safe to go home.  I would like for you to follow-up with your doctor within the next several days, have them obtain your results and follow-up with them to review all of these tests.  If you should develop severe or worsening symptoms return to the emergency department immediately  Please take MiraLAX 3 times a day, drink 1 bottle of magnesium citrate, this will likely cause you to have excessive amounts of diarrhea but should help with all of your constipation.  Once you have had all of your stool come out you should take Colace once a day to help as a stool softener, follow-up with your doctor for recheck in 3 days if no improvement but come back to the ER for worsening symptoms   It is also important for you to have your blood levels rechecked as you were anemic today, you have been anemic in the past and it does not appear much different but this should be followed by your doctor, please let them know

## 2021-11-06 NOTE — ED Provider Notes (Signed)
[facility] EMERGENCY DEPARTMENT Provider Note   CSN: [id number] Arrival date & time: 11/06/21  1502     History  Chief Complaint  Patient presents with   Abdominal Pain    Candace [name] is a 76 y.o. female.   Abdominal Pain  This patient is a very pleasant 76 year old female, she struggles with constipation and intermittently, she is on Zyprexa and Depakote for her history of depression and bipolar disorder.  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  She states that she intermittently gets constipated and has not had a satisfying bowel movement in 4 or 5 days.  She did pass a very small amount of stool this morning.  She reports that with eating she gets a postprandial fullness that is uncomfortable but not particularly painful.  There is no nausea vomiting or diarrhea there is no fevers or chills, no coughing or shortness of breath.  Home Medications Prior to Admission medications   Medication Sig Start Date End Date Taking? Authorizing Provider  docusate sodium (COLACE) 100 MG capsule Take 100 mg by mouth daily as needed for mild constipation.     [provider]  famotidine (PEPCID) 20 MG tablet TAKE 1 TABLET BY MOUTH ONCE DAILY. 10/29/21   Carlan, Chelsea L, NP  fluticasone (FLONASE) 50 MCG/ACT nasal spray Place 1 spray into both nostrils daily. 09/08/18   [name], MD  hydrochlorothiazide (MICROZIDE) 12.5 MG capsule Take 25 mg by mouth daily.     [provider]  losartan (COZAAR) 25 MG tablet Take 25 mg by mouth daily.  05/08/19   [provider]  meclizine (ANTIVERT) 25 MG tablet Take 1 tablet (25 mg total) by mouth 3 (three) times daily as needed for dizziness. 09/08/18   [name], MD  Multiple Vitamin (MULTIVITAMIN WITH MINERALS) TABS tablet Take 1 tablet by mouth daily.    [provider]  OLANZapine (ZYPREXA) 5 MG tablet Take 5 mg by mouth at bedtime.     [provider]  potassium chloride (KLOR-CON) 20 MEQ packet Take 20 mEq by mouth daily.    [provider]  pravastatin (PRAVACHOL) 20 MG tablet Take 20 mg by mouth at bedtime.      [provider]  divalproex (DEPAKOTE) 250 MG EC tablet Take 250 mg by mouth 2 (two) times daily.    08/29/11  [provider]      Allergies    Diclofenac sodium, Aspirin, and Penicillins    Review of Systems   Review of Systems  Gastrointestinal:  Positive for abdominal pain.  All other systems reviewed and are negative.  Physical Exam Updated Vital Signs BP 112/69   Pulse 80   Temp 98.7 F (37.1 C) (Oral)   Resp 16   Ht 1.626 m ('5\' 4"'$ )   Wt 59 kg   SpO2 99%   BMI 22.31 kg/m  Physical Exam Vitals and nursing note reviewed.  Constitutional:      General: She is not in acute distress.    Appearance: She is well-developed.  HENT:     Head: Normocephalic and atraumatic.     Mouth/Throat:     Pharynx: No oropharyngeal exudate.  Eyes:     General: No scleral icterus.       Right eye: No discharge.        Left eye: No discharge.     Conjunctiva/sclera: Conjunctivae normal.  Pupils: Pupils are equal, round, and reactive to light.  Neck:     Thyroid: No thyromegaly.     Vascular: No JVD.  Cardiovascular:     Rate and Rhythm: Normal rate and regular rhythm.     Heart sounds: Normal heart sounds. No murmur heard.   No friction rub. No gallop.  Pulmonary:     Effort: Pulmonary effort is normal. No respiratory distress.     Breath sounds: Normal breath sounds. No wheezing or rales.  Abdominal:     General: Bowel sounds are normal. There is no distension.     Palpations: Abdomen is soft. There is no mass.     Tenderness: There is no abdominal tenderness.     Comments: No focal tenderness to palpation, there is a large scar across the entire upper abdomen both right upper middle and left upper quadrant which is completely healed.  She reports that she has  had a cholecystectomy and an appendectomy in the past.  She has never had pancreatitis and does not drink alcohol.  She has no focal tenderness throughout the abdomen, there is no pain McBurney's point and she endorses prior appendectomy as well.  She has no focal tenderness in the left lower quadrant but does have a fullness consistent with constipation  Musculoskeletal:        General: No tenderness. Normal range of motion.     Cervical back: Normal range of motion and neck supple.     Right lower leg: No edema.     Left lower leg: No edema.  Lymphadenopathy:     Cervical: No cervical adenopathy.  Skin:    General: Skin is warm and dry.     Findings: No erythema or rash.  Neurological:     Mental Status: She is alert.     Coordination: Coordination normal.  Psychiatric:        Behavior: Behavior normal.    ED Results / Procedures / Treatments   Labs (all labs ordered are listed, but only abnormal results are displayed) Labs Reviewed  COMPREHENSIVE METABOLIC PANEL - Abnormal; Notable for the following components:      Result Value   Potassium 3.2 (*)    Glucose, Bld 108 (*)    Calcium 8.6 (*)    All other components within normal limits  CBC WITH DIFFERENTIAL/PLATELET - Abnormal; Notable for the following components:   RBC 3.57 (*)    Hemoglobin 10.6 (*)    HCT 33.1 (*)    All other components within normal limits  LIPASE, BLOOD    EKG None  Radiology DG ABD ACUTE 2+V W 1V CHEST  Result Date: 11/06/2021 CLINICAL DATA:  abd pain / constipation EXAM: DG ABDOMEN ACUTE WITH 1 VIEW CHEST COMPARISON:  Chest radiograph dated August 29, 2011 FINDINGS: The cardiomediastinal silhouette is unchanged in contour.Tortuous thoracic aorta. No pleural effusion. No pneumothorax. No acute pleuroparenchymal abnormality. Air and stool-filled nondilated loops of bowel. No free air. Mild to moderate colonic stool burden diffusely throughout the colon. Surgical clips project over the upper abdomen.  Degenerative changes of the lumbar spine. Vascular calcifications. IMPRESSION: Negative abdominal radiographs.  No acute cardiopulmonary disease. Electronically Signed   By: Valentino Saxon M.D.   On: 11/06/2021 16:27    Procedures Procedures    Medications Ordered in ED Medications - No data to display  ED Course/ Medical Decision Making/ A&P  Medical Decision Making Amount and/or Complexity of Data Reviewed Labs: ordered. Radiology: ordered.   This patient presents to the ED for concern of abdominal discomfort differential diagnosis includes constipation, seems less likely to be bowel obstruction given no nausea or vomiting, seems less likely to be mesenteric ischemia without frank pain and with normal vital signs.  No history of atrial fibrillation.    Additional history obtained:  Additional history obtained from electronic medical record External records from outside source obtained and reviewed including multiple office visits for psychiatry, no recent surgical evaluation   Lab Tests:  I Ordered, and personally interpreted labs.  The pertinent results include: CBC metabolic panel, lipase, they show no acute findings other than mild chronic anemia   Imaging Studies ordered:  I ordered imaging studies including acute abdominal series I independently visualized and interpreted imaging which showed acute abdominal series showing no signs of acute bowel obstruction or free air, there is some signs of heavy stool burden consistent with constipation I agree with the radiologist interpretation   Medicines ordered and prescription drug management:  I ordered medication including recommendations for constipation for MiraLAX, magnesium citrate, Colace I have reviewed the patients home medicines and have made adjustments as needed   Problem List / ED Course:  Abdominal discomfort is minimal, more concerns of constipation, work-up shows  well-appearing, stable for discharge, normal vital signs with a blood pressure of 112/69 and a pulse of 80 at the time of discharge   I have discussed with the patient at the bedside the results, and the meaning of these results.  They have expressed her understanding to the need for follow-up with primary care physician   Social Determinants of Health:  None           Final Clinical Impression(s) / ED Diagnoses Final diagnoses:  Other constipation  Chronic anemia    Rx / DC Orders ED Discharge Orders     None         Noemi Chapel, MD 11/06/21 1826

## 2021-11-06 NOTE — ED Notes (Signed)
Candace [name], neighbor/friend, to be contacted at  [phone] if patient is discharged.

## 2021-11-06 NOTE — ED Triage Notes (Signed)
Patient c/o abd pain in umbilical region x2 days. Denies any nausea, vomiting, diarrhea, fevers, or urinary symptoms. Per patient constipation x3-4 days. Patient reports small BM today with straining. Denies any blood in stool. Per patient was seen here about a year ago for same reason and given "pills" and told to take Mirilax which resolved the episode. Patient also reports hx of diverticulitis.

## 2021-11-10 NOTE — Telephone Encounter (Signed)
Left message to return call 

## 2022-01-16 ENCOUNTER — Other Ambulatory Visit: Payer: Self-pay

## 2022-01-16 ENCOUNTER — Encounter (HOSPITAL_COMMUNITY): Payer: Self-pay

## 2022-01-16 DIAGNOSIS — R42 Dizziness and giddiness: Secondary | ICD-10-CM | POA: Diagnosis present

## 2022-01-16 NOTE — ED Triage Notes (Signed)
Pov from home. Cc of vertigo since tonight. Has hx of the same. Stated "it feels like I'm on a boat"

## 2022-01-17 ENCOUNTER — Emergency Department (HOSPITAL_COMMUNITY)
Admission: EM | Admit: 2022-01-17 | Discharge: 2022-01-17 | Disposition: A | Discharge status: Home / Self Care | Payer: Medicare HMO | Attending: Emergency Medicine | Admitting: Emergency Medicine

## 2022-01-17 ENCOUNTER — Emergency Department (HOSPITAL_COMMUNITY): Payer: Medicare HMO

## 2022-01-17 DIAGNOSIS — R42 Dizziness and giddiness: Secondary | ICD-10-CM

## 2022-01-17 MED ORDER — MECLIZINE HCL 25 MG PO TABS: 25.0000 mg | ORAL_TABLET | Freq: Three times a day (TID) | ORAL | 0 refills | Status: AC | PRN

## 2022-01-17 NOTE — ED Provider Notes (Signed)
[facility] EMERGENCY DEPARTMENT Provider Note   CSN: [id number] Arrival date & time: 01/16/22  2152     History  Chief Complaint  Patient presents with   Dizziness    Vertigo    Candace [name] is a 76 y.o. female.  Patient presents to the emergency department for evaluation of what she thinks is vertigo.  Patient reports around 7 or 8 PM she had onset of severe dizziness that caused her to "stagger" when she walked.  She had to hold onto walls to ambulate.  She reports since that time, however, symptoms have slowly improved and are completely resolved at this time.  No residual dizziness.  No unilateral stroke symptoms.  She does have a history of vertigo with similar symptoms in the past.       Home Medications Prior to Admission medications   Medication Sig Start Date End Date Taking? Authorizing Provider  docusate sodium (COLACE) 100 MG capsule Take 100 mg by mouth daily as needed for mild constipation.     [provider]  famotidine (PEPCID) 20 MG tablet TAKE 1 TABLET BY MOUTH ONCE DAILY. 10/29/21   Carlan, Chelsea L, NP  fluticasone (FLONASE) 50 MCG/ACT nasal spray Place 1 spray into both nostrils daily. 09/08/18   [name], MD  hydrochlorothiazide (MICROZIDE) 12.5 MG capsule Take 25 mg by mouth daily.     [provider]  losartan (COZAAR) 25 MG tablet Take 25 mg by mouth daily.  05/08/19   [provider]  meclizine (ANTIVERT) 25 MG tablet Take 1 tablet (25 mg total) by mouth 3 (three) times daily as needed for dizziness. 01/17/22   Orpah Greek, MD  Multiple Vitamin (MULTIVITAMIN WITH MINERALS) TABS tablet Take 1 tablet by mouth daily.    [provider]  OLANZapine (ZYPREXA) 5 MG tablet Take 5 mg by mouth at bedtime.    [provider]  potassium chloride (KLOR-CON) 20 MEQ packet Take 20 mEq by mouth daily.    [provider]  pravastatin (PRAVACHOL) 20 MG tablet Take 20 mg by mouth at bedtime.       [provider]  divalproex (DEPAKOTE) 250 MG EC tablet Take 250 mg by mouth 2 (two) times daily.    08/29/11  [provider]      Allergies    Diclofenac sodium, Aspirin, and Penicillins    Review of Systems   Review of Systems  Physical Exam Updated Vital Signs BP (!) 142/76   Pulse 69   Temp 98.1 F (36.7 C)   Resp 14   Ht '5\' 2"'$  (1.575 m)   Wt 59 kg   SpO2 100%   BMI 23.78 kg/m  Physical Exam Vitals and nursing note reviewed.  Constitutional:      General: She is not in acute distress.    Appearance: She is well-developed.  HENT:     Head: Normocephalic and atraumatic.     Mouth/Throat:     Mouth: Mucous membranes are moist.  Eyes:     General: Vision grossly intact. Gaze aligned appropriately.     Extraocular Movements: Extraocular movements intact.     Conjunctiva/sclera: Conjunctivae normal.  Cardiovascular:     Rate and Rhythm: Normal rate and regular rhythm.     Pulses: Normal pulses.     Heart sounds: Normal heart sounds, S1 normal and S2 normal. No murmur heard.    No friction rub. No gallop.  Pulmonary:     Effort: Pulmonary effort  is normal. No respiratory distress.     Breath sounds: Normal breath sounds.  Abdominal:     General: Bowel sounds are normal.     Palpations: Abdomen is soft.     Tenderness: There is no abdominal tenderness. There is no guarding or rebound.     Hernia: No hernia is present.  Musculoskeletal:        General: No swelling.     Cervical back: Full passive range of motion without pain, normal range of motion and neck supple. No spinous process tenderness or muscular tenderness. Normal range of motion.     Right lower leg: No edema.     Left lower leg: No edema.  Skin:    General: Skin is warm and dry.     Capillary Refill: Capillary refill takes less than 2 seconds.     Findings: No ecchymosis, erythema, rash or wound.  Neurological:     General: No focal deficit present.     Mental Status: She is alert  and oriented to person, place, and time.     GCS: GCS eye subscore is 4. GCS verbal subscore is 5. GCS motor subscore is 6.     Cranial Nerves: Cranial nerves 2-12 are intact.     Sensory: Sensation is intact.     Motor: Motor function is intact.     Coordination: Coordination is intact.  Psychiatric:        Attention and Perception: Attention normal.        Mood and Affect: Mood normal.        Speech: Speech normal.        Behavior: Behavior normal.     ED Results / Procedures / Treatments   Labs (all labs ordered are listed, but only abnormal results are displayed) Labs Reviewed - No data to display  EKG EKG Interpretation  Date/Time:  Monday January 17 2022 01:12:34 EDT Ventricular Rate:  52 PR Interval:  197 QRS Duration: 94 QT Interval:  424 QTC Calculation: 395 R Axis:   37 Text Interpretation: Sinus rhythm Low voltage, precordial leads Confirmed by Orpah Greek [phone]) on 01/17/2022 1:15:36 AM  Radiology CT HEAD WO CONTRAST (5MM)  Result Date: 01/17/2022 CLINICAL DATA:  Dizziness and vertigo. EXAM: CT HEAD WITHOUT CONTRAST TECHNIQUE: Contiguous axial images were obtained from the base of the skull through the vertex without intravenous contrast. RADIATION DOSE REDUCTION: This exam was performed according to the departmental dose-optimization program which includes automated exposure control, adjustment of the mA and/or kV according to patient size and/or use of iterative reconstruction technique. COMPARISON:  Head CT 09/08/2018. FINDINGS: Brain: There is mild cerebral atrophy and small-vessel disease, slight cerebellar atrophy. Ventricles are normal in size and position. Partially empty sella is again noted and trace mineralization in the posterior basal ganglia. No asymmetry is seen concerning for an acute cortical based infarct, hemorrhage, mass or midline shift. Basal cisterns are clear. Vascular: There are scattered calcifications of the carotid siphons. There are  no hyperdense central vessels. Skull: No fracture or focal lesion is seen. Sinuses/Orbits: Visualized sinuses are clear with again noted opacification in the bilateral lower mastoid air cells. No middle ear opacity is seen. Other: None. IMPRESSION: 1. No acute intracranial CT findings or interval changes. 2. 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 3. Partially empty sella. Electronically Signed   By: Telford Nab M.D.   On: 01/17/2022 01:34    Procedures Procedures    Medications Ordered  in ED Medications - No data to display  ED Course/ Medical Decision Making/ A&P                           Medical Decision Making Amount and/or Complexity of Data Reviewed Radiology: ordered.   Presents for vertiginous dizziness that occurred earlier today.  Symptoms have since resolved.  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  Patient has a normal neurologic exam and appears well.  Head CT negative, EKG normal.  Vital signs unremarkable.  Suspect recurrence of positional vertigo, discharged with meclizine.  Return if symptoms recur.        Final Clinical Impression(s) / ED Diagnoses Final diagnoses:  Vertigo    Rx / DC Orders ED Discharge Orders          Ordered    meclizine (ANTIVERT) 25 MG tablet  3 times daily PRN        01/17/22 0137              Orpah Greek, MD 01/17/22 [phone]

## 2022-03-24 ENCOUNTER — Ambulatory Visit (INDEPENDENT_AMBULATORY_CARE_PROVIDER_SITE_OTHER): Payer: Medicare HMO | Admitting: Gastroenterology

## 2022-03-24 ENCOUNTER — Encounter (INDEPENDENT_AMBULATORY_CARE_PROVIDER_SITE_OTHER): Payer: Self-pay | Admitting: Gastroenterology

## 2022-03-24 VITALS — BP 145/83 | HR 80 | Temp 98.1°F | Ht 62.0 in | Wt 120.4 lb

## 2022-03-24 DIAGNOSIS — R634 Abnormal weight loss: Secondary | ICD-10-CM | POA: Diagnosis not present

## 2022-03-24 DIAGNOSIS — R6881 Early satiety: Secondary | ICD-10-CM | POA: Diagnosis not present

## 2022-03-24 DIAGNOSIS — Z1211 Encounter for screening for malignant neoplasm of colon: Secondary | ICD-10-CM

## 2022-03-24 DIAGNOSIS — K5900 Constipation, unspecified: Secondary | ICD-10-CM | POA: Diagnosis not present

## 2022-03-24 NOTE — Progress Notes (Unsigned)
Referring Provider: [facility]* Primary Care Physician:  [facility] Primary GI Physician: Jenetta Downer  Chief Complaint  Patient presents with   [facility]    Patient here today having issues with abdominal pain and bloating. She is also having issues with constipation. She says she has been using miralax one time daily, this has not been beneficial to her. She says she is also taking a stool softener daily.   HPI:   Candace [name] is a 76 y.o. female with past medical history of HTN, GERD, constipation, erosive esophagitis, gastric ulcer, leiomyoma.  Patient presenting today for abdominal pain and bloating.  Last seen in august 2022, at that time GERD doing well taking pepcid in the mornings. Taking miralax for constipation as needed, doing colace once daily. Having a BM every other day. Down 6 pounds, doing 2 good meals and snacks between, also with a lot of walking for her job. Recommended continue miralax PRN, colace daily, and pepcid daily.   Present Patient states she is doing 1 capful of Miralax per day and 1 colace per day, having a BM every other day. Notes that stools are still somewhat hard and require straining at times. Feels she is drinking a good amount of water. Denies abdominal pain. She endorses some bloating at times that gets better with a BM. She is down about 9 pounds since her last visit, down 17 total over the past 3 years. She notes that she recently had to start a medication for anxiety and wonders if this has effected her appetite. Denies rectal bleeding or melena. She denies nausea or vomiting. Denies dysphagia or odynophagia. She notes some issues finishing meals at one sitting, sometimes will have to come back and finish later on though she denies any overt abdominal pain. 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   Notably had ER visit in June, KUB showed moderate colonic stool burden.    Patient is requesting to have cologuard ordered. States this will give her peace of mind.   Last Colonoscopy:(2018)diverticulosis throughout colon, internal hemorrhoids, no polyps Last Endoscopy: (2013) patchy whitish exudate coating mucosa of distal half of the esophagus (confirmed candida). Spontaneous reflux of bile noted in distal esophagus. Small sliding HH. Small gastric pouch with patent B1 anastomosis, 23m gastric polyp (hyperplastic gastric polyp).    Recommendations:  No further colonoscopy recommended given patient's advanced age, unless otherwise clinically indicated   No family history of CRC or liver disease. No history of tobacco or alcohol use. Past Medical History:  Diagnosis Date   Diverticulosis    Erosive esophagitis    GERD (gastroesophageal reflux disease)    Hypertension    Hypertension     Past Surgical History:  Procedure Laterality Date   ABDOMINAL HYSTERECTOMY     ABDOMINAL SURGERY     APPENDECTOMY     COLONOSCOPY N/A 06/16/2016   rehman:diverticulosis throughout colon, internal hemorrhoids, no polyps   ESOPHAGOGASTRODUODENOSCOPY  11/10/2011   Re[facility]:patchy whitish exudate coating mucosa of distal half of the esophagus (confirmed candida). Spontaneous reflux of bile noted in distal esophagus. Small sliding HH. Small gastric pouch with patent B1 anastomosis, 716mgastric polyp (hyperplastic gastric polyp).   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      famotidine (PEPCID) 20 MG tablet TAKE 1 TABLET BY MOUTH ONCE DAILY. 90 tablet 0   fluticasone (FLONASE) 50 MCG/ACT nasal spray Place 1 spray into both nostrils daily. 16 g 0   hydrochlorothiazide (MICROZIDE) 12.5 MG capsule Take 25 mg by mouth daily.      losartan (COZAAR) 25 MG tablet Take 25 mg by  mouth daily.      meclizine (ANTIVERT) 25 MG tablet Take 1 tablet (25 mg total) by mouth 3 (three) times daily as needed for dizziness. 30 tablet 0   Multiple Vitamin (MULTIVITAMIN WITH MINERALS) TABS tablet Take 1 tablet by mouth daily.     OLANZapine (ZYPREXA) 5 MG tablet Take 5 mg by mouth at bedtime.     potassium chloride (KLOR-CON) 20 MEQ packet Take 20 mEq by mouth daily.     pravastatin (PRAVACHOL) 20 MG tablet Take 20 mg by mouth at bedtime.       No current facility-administered medications for this visit.    Allergies as of 03/24/2022 - Review Complete 03/24/2022  Allergen Reaction Noted   Diclofenac sodium Rash 07/31/2013   Aspirin Itching and Swelling 02/21/2011   Penicillins Hives, Itching, and Swelling 02/21/2011    History reviewed. No pertinent family history.  Social History   Socioeconomic History   Marital status: Single    Spouse name: Not on file   Number of children: Not on file   Years of education: Not on file   Highest education level: Not on file  Occupational History   Not on file  Tobacco Use   Smoking status: Never   Smokeless tobacco: Former  Scientific laboratory technician Use: Never used  Substance and Sexual Activity   Alcohol use: No   Drug use: No   Sexual activity: Not on file  Other Topics Concern   Not on file  Social History Narrative   Not on file   Social Determinants of Health   Financial Resource Strain: Not on file  Food Insecurity: Not on file  Transportation Needs: Not on file  Physical Activity: Not on file  Stress: Not on file  Social Connections: Not on file   Review of systems General: negative for malaise, night sweats, fever, chills, +weight loss  Neck: Negative for lumps, goiter, pain and significant neck swelling Resp: Negative for cough, wheezing, dyspnea at rest CV: Negative for chest pain, leg swelling, palpitations, orthopnea GI: denies melena, hematochezia, nausea, vomiting, diarrhea, dysphagia, odyonophagia,  early satiety +constipation +weight loss MSK: Negative for joint pain or swelling, back pain, and muscle pain. Derm: Negative for itching or rash Psych: Denies depression, anxiety, memory loss, confusion. No homicidal or suicidal ideation.  Heme: Negative for prolonged bleeding, bruising easily, and swollen nodes. Endocrine: Negative for cold or heat intolerance, polyuria, polydipsia and goiter. Neuro: negative for tremor, gait imbalance, syncope and seizures. The remainder of the review of systems is noncontributory.  Physical Exam: BP (!) 145/83 (BP Location: Left Arm, Patient Position: Sitting, Cuff Size: Small)   Pulse 80   Temp 98.1 F (36.7 C) (Oral)   Ht '5\' 2"'$  (1.575 m)   Wt 120 lb 6.4 oz (54.6 kg)   BMI 22.02 kg/m  General:   Alert and oriented. No distress noted. Pleasant and cooperative.  Head:  Normocephalic and atraumatic. Eyes:  Conjuctiva clear without scleral icterus. Mouth:  Oral mucosa pink and moist. Good dentition. No lesions. Heart: Normal  rate and rhythm, s1 and s2 heart sounds present.  Lungs: Clear lung sounds in all lobes. Respirations equal and unlabored. Abdomen:  +BS, soft, non-tender and non-distended. No rebound or guarding. No HSM or masses noted. Derm: No palmar erythema or jaundice Msk:  Symmetrical without gross deformities. Normal posture. Extremities:  Without edema. Neurologic:  Alert and  oriented x4 Psych:  Alert and cooperative. Normal mood and affect.  Invalid input(s): "6 MONTHS"   ASSESSMENT: Candace [name] is a 76 y.o. female presenting today for constipation and bloating.  Patient has chronic history of constipation, bloating and weight loss of approx 17 lbs over the past 3 years. She continues to have some issues with constipation with harder stools and bloating that improves with good BM. She is taking colace and 1 capful miralax daily, has not tried increasing miralax. Denies nausea, vomiting, rectal bleeding, melena, abdominal pain  or changes in appetite. Notably down 9 pounds since last visit, with some early satiety at times. 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 KUB in June showed moderate stool burden. Recommend increasing miralax to BID x1 week then up to TID if needed thereafter. She should let me know if this does not improve her constipation. Recommend proceeding with EGD for further evaluation of ongoing weight loss as we cannot rule out PUD or malignancy.   Notably patient requesting to have cologuard ordered. She has no symptoms indicating need for this. We discussed guidelines to stop CRC screening after age 70 in average risk patients. Last colonoscopy in 2018 with diverticulosis and hemorrhoids. I advised her that insurance may not cover cologuard and if this is positive, she will need to have a colonoscopy. Patient verbalized understanding and still would like to proceed with cologuard stating she Is willing to pay out of pocket if needed.    PLAN:  Cologuard -per patient's request  2. Increase miralax to BID x1 week, TID thereafter 3. Increase water intake, high fiber diet  4. Consider linzess/amitiza if no improvement 5. EGD for early satiety, weight loss-ASA III ENDO 3  All questions were answered, patient verbalized understanding and is in agreement with plan as outlined above.     Follow Up: 6 months   Myrla Malanowski L. Alver Sorrow, MSN, APRN, AGNP-C Adult-Gerontology Nurse Practitioner [facility]

## 2022-03-24 NOTE — Patient Instructions (Addendum)
Start taking Miralax 1 capful every 12 hours. If after one week there is no improvement, increase to 1 capful every 8 hours. Please let me know if you do not feel that this is working for you over the next few weeks and we can consider a prescription strength constipation medicine Make sure you are drinking plenty of water and eating a diet high in fiber I have ordered cologuard at your request, insurance may not cover this given you age and no high risks factors, please be aware of this, if this is positive, we would then recommend a colonoscopy. Be mindful we typically do not screen for colon cancer after age 64 unless you have risk factors, which you do not.   Follow up 6 months

## 2022-03-28 DIAGNOSIS — R6881 Early satiety: Secondary | ICD-10-CM | POA: Insufficient documentation

## 2022-03-28 DIAGNOSIS — R634 Abnormal weight loss: Secondary | ICD-10-CM | POA: Insufficient documentation

## 2022-04-01 ENCOUNTER — Other Ambulatory Visit (INDEPENDENT_AMBULATORY_CARE_PROVIDER_SITE_OTHER): Payer: Self-pay

## 2022-04-01 ENCOUNTER — Encounter (INDEPENDENT_AMBULATORY_CARE_PROVIDER_SITE_OTHER): Payer: Self-pay

## 2022-04-01 DIAGNOSIS — I10 Essential (primary) hypertension: Secondary | ICD-10-CM

## 2022-04-01 DIAGNOSIS — R6881 Early satiety: Secondary | ICD-10-CM

## 2022-04-01 DIAGNOSIS — R634 Abnormal weight loss: Secondary | ICD-10-CM

## 2022-04-04 ENCOUNTER — Encounter (INDEPENDENT_AMBULATORY_CARE_PROVIDER_SITE_OTHER): Payer: Self-pay

## 2022-04-11 LAB — COLOGUARD: COLOGUARD: NEGATIVE

## 2022-04-12 ENCOUNTER — Telehealth (INDEPENDENT_AMBULATORY_CARE_PROVIDER_SITE_OTHER): Payer: Self-pay | Admitting: *Deleted

## 2022-04-12 NOTE — Telephone Encounter (Signed)
Patient asking for results of cologard test.   [phone]

## 2022-04-13 NOTE — Telephone Encounter (Signed)
Called and discussed with patient and she verbalized understanding.

## 2022-04-30 ENCOUNTER — Other Ambulatory Visit (INDEPENDENT_AMBULATORY_CARE_PROVIDER_SITE_OTHER): Payer: Self-pay | Admitting: Gastroenterology

## 2022-05-19 NOTE — Patient Instructions (Signed)
Candace [name]  05/19/2022     '@PREFPERIOPPHARMACY'$ @   Your procedure is scheduled on  05/24/2022.     Please report to the Main entrance at 1045 AM.     Call this number if you have problems the morning of surgery:  [phone]  If you experience any cold or flu symptoms such as cough, fever, chills, shortness of breath, etc. between now and your scheduled surgery, please notify us at the above number.   Remember:   Follow the diet instructions given to you by the office.     Take these medicines the morning of surgery with A SIP OF WATER                                     pepcid, meclizine.     Do not wear jewelry, make-up or nail polish.  Do not wear lotions, powders, or perfumes, or deodorant.  Do not shave 48 hours prior to surgery.  Men may shave face and neck.  Do not bring valuables to the hospital.  [facility] is not responsible for any belongings or valuables.  Contacts, dentures or bridgework may not be worn into surgery.  Leave your suitcase in the car.  After surgery it may be brought to your room.  For patients admitted to the hospital, discharge time will be determined by your treatment team.  Patients discharged the day of surgery will not be allowed to drive home and must have someone with them for 24 hours.    Special instructions:   DO NOT smoke tobacco or vape for 24 hours before your procedure.  Please read over the following fact sheets that you were given. Anesthesia Post-op Instructions and Care and Recovery After Surgery      Upper Endoscopy, Adult, Care After After the procedure, it is common to have a sore throat. It is also common to have: Mild stomach pain or discomfort. Bloating. Nausea. Follow these instructions at home: The instructions below may help you care for yourself at home. Your health care provider may give you more instructions. If you have questions, ask your health care provider. If you were given a  sedative during the procedure, it can affect you for several hours. Do not drive or operate machinery until your health care provider says that it is safe. If you will be going home right after the procedure, plan to have a responsible adult: Take you home from the hospital or clinic. You will not be allowed to drive. Care for you for the time you are told. Follow instructions from your health care provider about what you may eat and drink. Return to your normal activities as told by your health care provider. Ask your health care provider what activities are safe for you. Take over-the-counter and prescription medicines only as told by your health care provider. Contact a health care provider if you: Have a sore throat that lasts longer than one day. Have trouble swallowing. Have a fever. Get help right away if you: Vomit blood or your vomit looks like coffee grounds. Have bloody, black, or tarry stools. Have a very bad sore throat or you cannot swallow. Have difficulty breathing or very bad pain in your chest or abdomen. These symptoms may be an emergency. Get help right away. Call 911. Do not wait to see if the symptoms will go away. Do  not drive yourself to the hospital. Summary After the procedure, it is common to have a sore throat, mild stomach discomfort, bloating, and nausea. If you were given a sedative during the procedure, it can affect you for several hours. Do not drive until your health care provider says that it is safe. Follow instructions from your health care provider about what you may eat and drink. Return to your normal activities as told by your health care provider. This information is not intended to replace advice given to you by your health care provider. Make sure you discuss any questions you have with your health care provider. Document Revised: 09/01/2021 Document Reviewed: 09/01/2021 Elsevier Patient Education  [place] After The following information offers guidance on how to care for yourself after your procedure. Your health care provider may also give you more specific instructions. If you have problems or questions, contact your health care provider. What can I expect after the procedure? After the procedure, it is common to have: Tiredness. Little or no memory about what happened during or after the procedure. Impaired judgment when it comes to making decisions. Nausea or vomiting. Some trouble with balance. Follow these instructions at home: For the time period you were told by your health care provider:  Rest. Do not participate in activities where you could fall or become injured. Do not drive or use machinery. Do not drink alcohol. Do not take sleeping pills or medicines that cause drowsiness. Do not make important decisions or sign legal documents. Do not take care of children on your own. Medicines Take over-the-counter and prescription medicines only as told by your health care provider. If you were prescribed antibiotics, take them as told by your health care provider. Do not stop using the antibiotic even if you start to feel better. Eating and drinking Follow instructions from your health care provider about what you may eat and drink. Drink enough fluid to keep your urine pale yellow. If you vomit: Drink clear fluids slowly and in small amounts as you are able. Clear fluids include water, ice chips, low-calorie sports drinks, and fruit juice that has water added to it (diluted fruit juice). Eat light and bland foods in small amounts as you are able. These foods include bananas, applesauce, rice, lean meats, toast, and crackers. General instructions  Have a responsible adult stay with you for the time you are told. It is important to have someone help care for you until you are awake and alert. If you have sleep apnea, surgery and some medicines can increase your risk for  breathing problems. Follow instructions from your health care provider about wearing your sleep device: When you are sleeping. This includes during daytime naps. While taking prescription pain medicines, sleeping medicines, or medicines that make you drowsy. Do not use any products that contain nicotine or tobacco. These products include cigarettes, chewing tobacco, and vaping devices, such as e-cigarettes. If you need help quitting, ask your health care provider. Contact a health care provider if: You feel nauseous or vomit every time you eat or drink. You feel light-headed. You are still sleepy or having trouble with balance after 24 hours. You get a rash. You have a fever. You have redness or swelling around the IV site. Get help right away if: You have trouble breathing. You have new confusion after you get home. These symptoms may be an emergency. Get help right away. Call 911. Do not wait to see if  the symptoms will go away. Do not drive yourself to the hospital. This information is not intended to replace advice given to you by your health care provider. Make sure you discuss any questions you have with your health care provider. Document Revised: 10/18/2021 Document Reviewed: 10/18/2021 Elsevier Patient Education  [place].

## 2022-05-20 ENCOUNTER — Encounter (HOSPITAL_COMMUNITY)
Admission: RE | Admit: 2022-05-20 | Discharge: 2022-05-20 | Disposition: A | Discharge status: Home / Self Care | Payer: Medicare HMO | Source: Ambulatory Visit | Attending: Gastroenterology | Admitting: Gastroenterology

## 2022-05-20 ENCOUNTER — Other Ambulatory Visit: Payer: Self-pay

## 2022-05-20 DIAGNOSIS — I10 Essential (primary) hypertension: Secondary | ICD-10-CM | POA: Diagnosis not present

## 2022-05-20 DIAGNOSIS — R9431 Abnormal electrocardiogram [ECG] [EKG]: Secondary | ICD-10-CM | POA: Diagnosis not present

## 2022-05-20 DIAGNOSIS — R634 Abnormal weight loss: Secondary | ICD-10-CM

## 2022-05-20 DIAGNOSIS — Z01812 Encounter for preprocedural laboratory examination: Secondary | ICD-10-CM | POA: Insufficient documentation

## 2022-05-20 DIAGNOSIS — R6881 Early satiety: Secondary | ICD-10-CM

## 2022-05-20 NOTE — Progress Notes (Signed)
Patient EGD for 05/24/22 will be cancelled for now.  She is c/o congestion runny nose and chest fullness.  EKG normal.  We will postpone procedure until patient is free of symptoms for 2 weeks. She is to follow up with her primary provider as soon as possible.

## 2022-05-21 ENCOUNTER — Emergency Department (HOSPITAL_COMMUNITY)
Admission: EM | Admit: 2022-05-21 | Discharge: 2022-05-21 | Disposition: A | Discharge status: Home / Self Care | Payer: Medicare HMO | Attending: Emergency Medicine | Admitting: Emergency Medicine

## 2022-05-21 ENCOUNTER — Emergency Department (HOSPITAL_COMMUNITY): Payer: Medicare HMO

## 2022-05-21 ENCOUNTER — Other Ambulatory Visit: Payer: Self-pay

## 2022-05-21 ENCOUNTER — Encounter (HOSPITAL_COMMUNITY): Payer: Self-pay

## 2022-05-21 DIAGNOSIS — Z1152 Encounter for screening for COVID-19: Secondary | ICD-10-CM | POA: Insufficient documentation

## 2022-05-21 DIAGNOSIS — I10 Essential (primary) hypertension: Secondary | ICD-10-CM | POA: Insufficient documentation

## 2022-05-21 DIAGNOSIS — J069 Acute upper respiratory infection, unspecified: Secondary | ICD-10-CM | POA: Diagnosis not present

## 2022-05-21 DIAGNOSIS — R079 Chest pain, unspecified: Secondary | ICD-10-CM | POA: Diagnosis present

## 2022-05-21 LAB — BASIC METABOLIC PANEL
Anion gap: 6 (ref 5–15)
BUN: 17 mg/dL (ref 8–23)
CO2: 29 mmol/L (ref 22–32)
Calcium: 8.9 mg/dL (ref 8.9–10.3)
Chloride: 102 mmol/L (ref 98–111)
Creatinine, Ser: 0.87 mg/dL (ref 0.44–1.00)
GFR, Estimated: >60 mL/min (ref >60)
Glucose, Bld: 92 mg/dL (ref 70–99)
Potassium: 3.4 mmol/L — ABNORMAL LOW (ref 3.5–5.1)
Sodium: 137 mmol/L (ref 135–145)

## 2022-05-21 LAB — TROPONIN I (HIGH SENSITIVITY): Troponin I (High Sensitivity): 4 ng/L (ref <18)

## 2022-05-21 LAB — CBC
HCT: 32.1 % — ABNORMAL LOW (ref 36.0–46.0)
Hemoglobin: 10.5 g/dL — ABNORMAL LOW (ref 12.0–15.0)
MCH: 30.1 pg (ref 26.0–34.0)
MCHC: 32.7 g/dL (ref 30.0–36.0)
MCV: 92 fL (ref 80.0–100.0)
Platelets: 218 10*3/uL (ref 150–400)
RBC: 3.49 MIL/uL — ABNORMAL LOW (ref 3.87–5.11)
RDW: 13.2 % (ref 11.5–15.5)
WBC: 4.5 10*3/uL (ref 4.0–10.5)
nRBC: 0 % (ref 0.0–0.2)

## 2022-05-21 LAB — RESP PANEL BY RT-PCR (RSV, FLU A&B, COVID)  RVPGX2
Influenza A by PCR: NEGATIVE
Influenza B by PCR: NEGATIVE
Resp Syncytial Virus by PCR: NEGATIVE
SARS Coronavirus 2 by RT PCR: NEGATIVE

## 2022-05-21 MED ORDER — FLUTICASONE PROPIONATE 50 MCG/ACT NA SUSP: 1.0000 | Freq: Every day | NASAL | 0 refills | Status: AC

## 2022-05-21 NOTE — ED Triage Notes (Signed)
Pt presents with a "chest fullness for over 1 week." The fullness sensation is relieved with APAP. Denies ShOB, nausea, or diaphoresis. Pt states she was seen in pre-op to have an EGD done and was told to be evaluated in the ED before they could proceed.

## 2022-05-21 NOTE — ED Provider Notes (Signed)
[facility] EMERGENCY DEPARTMENT Provider Note   CSN: [id number] Arrival date & time: 05/21/22  1605     History  Chief Complaint  Patient presents with   Chest Pain    Candace [name] is a 76 y.o. female.  Pt is a 76 yo female with a pmhx significant for htn, gerd, htn, and erosive esophagitis.  Pt has had chest congestion for about a week.  She said she has had some clear drainage from her right eye. She denies cough.  No sob.  No fever.  She went to pre-op yesterday for an EGD that was scheduled for 12/19.  They cancelled procedure and recommended she see her pcp.  She came here instead.  No sick contacts.          Home Medications Prior to Admission medications   Medication Sig Start Date End Date Taking? Authorizing Provider  fluticasone (FLONASE) 50 MCG/ACT nasal spray Place 1 spray into both nostrils daily. 05/21/22  Yes [name], MD  acetaminophen (TYLENOL) 500 MG tablet Take 500 mg by mouth every 6 (six) hours as needed for moderate pain.    [provider]  bismuth subsalicylate (PEPTO BISMOL) 262 MG/15ML suspension Take 30 mLs by mouth every 6 (six) hours as needed.    [provider]  cloNIDine (CATAPRES) 0.1 MG tablet Take 0.1 mg by mouth at bedtime.    [provider]  docusate sodium (COLACE) 100 MG capsule Take 100 mg by mouth 2 (two) times daily as needed for mild constipation.    [provider]  famotidine (PEPCID) 20 MG tablet TAKE 1 TABLET BY MOUTH ONCE DAILY. 05/02/22   Carlan, Chelsea L, NP  hydrochlorothiazide (HYDRODIURIL) 25 MG tablet Take 25 mg by mouth daily.    [provider]  losartan (COZAAR) 50 MG tablet Take 50 mg by mouth daily.    [provider]  meclizine (ANTIVERT) 25 MG tablet Take 1 tablet (25 mg total) by mouth 3 (three) times daily as needed for dizziness. 01/17/22   Orpah Greek, MD  mirtazapine (REMERON) 7.5 MG tablet Take 7.5 mg by mouth at bedtime.    [provider]  Multiple Vitamin (MULTIVITAMIN WITH MINERALS) TABS tablet Take 1 tablet by mouth daily.    [provider]  OLANZapine (ZYPREXA) 5 MG tablet Take 5 mg by mouth at bedtime.    [provider]  potassium chloride SA (KLOR-CON M) 20 MEQ tablet Take 20 mEq by mouth daily.    [provider]  pravastatin (PRAVACHOL) 20 MG tablet Take 20 mg by mouth at bedtime.      [provider]  divalproex (DEPAKOTE) 250 MG EC tablet Take 250 mg by mouth 2 (two) times daily.    08/29/11  [provider]      Allergies    Diclofenac sodium, Lisinopril, Aspirin, and Penicillins    Review of Systems   Review of Systems  Eyes:  Positive for discharge.  Respiratory:         Chest congestion  All other systems reviewed and are negative.   Physical Exam Updated Vital Signs BP 119/76   Pulse 71   Temp 98.5 F (36.9 C) (Oral)   Resp (!) 24   Ht '5\' 4"'$  (1.626 m)   Wt 54.4 kg   SpO2 100%   BMI 20.60 kg/m  Physical Exam Vitals and nursing note reviewed.  Constitutional:      Appearance: She is well-developed.  HENT:  Head: Normocephalic and atraumatic.  Eyes:     Extraocular Movements: Extraocular movements intact.     Pupils: Pupils are equal, round, and reactive to light.     Comments: Right eye with clear drainage.  No redness.  Cardiovascular:     Rate and Rhythm: Normal rate and regular rhythm.     Heart sounds: Normal heart sounds.  Pulmonary:     Effort: Pulmonary effort is normal.     Breath sounds: Normal breath sounds.  Abdominal:     General: Bowel sounds are normal.     Palpations: Abdomen is soft.  Musculoskeletal:        General: Normal range of motion.     Cervical back: Normal range of motion and neck supple.  Skin:    General: Skin is warm.     Capillary Refill: Capillary refill takes less than 2 seconds.  Neurological:     General: No focal deficit present.     Mental Status: She is alert and oriented to  person, place, and time.  Psychiatric:        Mood and Affect: Mood normal.        Behavior: Behavior normal.     ED Results / Procedures / Treatments   Labs (all labs ordered are listed, but only abnormal results are displayed) Labs Reviewed  BASIC METABOLIC PANEL - Abnormal; Notable for the following components:      Result Value   Potassium 3.4 (*)    All other components within normal limits  CBC - Abnormal; Notable for the following components:   RBC 3.49 (*)    Hemoglobin 10.5 (*)    HCT 32.1 (*)    All other components within normal limits  RESP PANEL BY RT-PCR (RSV, FLU A&B, COVID)  RVPGX2  TROPONIN I (HIGH SENSITIVITY)  TROPONIN I (HIGH SENSITIVITY)    EKG EKG Interpretation  Date/Time:  Saturday May 21 2022 16:18:58 EST Ventricular Rate:  68 PR Interval:  170 QRS Duration: 76 QT Interval:  396 QTC Calculation: 421 R Axis:   1 Text Interpretation: Normal sinus rhythm Normal ECG When compared with ECG of 20-May-2022 11:22, Criteria for Septal infarct are no longer Present Confirmed by [name] [phone]) on 05/21/2022 5:52:59 PM  Radiology DG Chest Port 1 View  Result Date: 05/21/2022 CLINICAL DATA:  Chest pain EXAM: PORTABLE CHEST 1 VIEW COMPARISON:  11/06/2021 and prior studies FINDINGS: Telemetry leads overlie the chest. The cardiomediastinal silhouette is unremarkable. Elevated RIGHT hemidiaphragm again noted. There is no evidence of focal airspace disease, pulmonary edema, suspicious pulmonary nodule/mass, pleural effusion, or pneumothorax. No acute bony abnormalities are identified. IMPRESSION: No active disease. Electronically Signed   By: Margarette Canada M.D.   On: 05/21/2022 16:46    Procedures Procedures    Medications Ordered in ED Medications - No data to display  ED Course/ Medical Decision Making/ A&P                           Medical Decision Making Amount and/or Complexity of Data Reviewed Labs: ordered. Radiology:  ordered.   This patient presents to the ED for concern of chest congestion, this involves an extensive number of treatment options, and is a complaint that carries with it a high risk of complications and morbidity.  The differential diagnosis includes cardiac, pulm, gi, covid/flu   Co morbidities that complicate the patient evaluation  htn, gerd, htn, and erosive esophagitis   Additional  history obtained:  Additional history obtained from epic chart review  Lab Tests:  I Ordered, and personally interpreted labs.  The pertinent results include:  covid/flu/rsv neg, cbc with hgb 10.5 (chronic), bmp nl; trop nl   Imaging Studies ordered:  I ordered imaging studies including CXR  I independently visualized and interpreted imaging which showed No active disease.  I agree with the radiologist interpretation   Cardiac Monitoring:  The patient was maintained on a cardiac monitor.  I personally viewed and interpreted the cardiac monitored which showed an underlying rhythm of: nsr   Medicines ordered and prescription drug management:   I have reviewed the patients home medicines and have made adjustments as needed   Problem List / ED Course:  URI:  no pna, no covid/flu/rsv.  Pt looks well and vitals are nl.  Pt is stable for d/c.  She is to return if worse.  F/u with pcp.   Reevaluation:  After the interventions noted above, I reevaluated the patient and found that they have :improved   Social Determinants of Health:  Lives at home   Dispostion:  After consideration of the diagnostic results and the patients response to treatment, I feel that the patent would benefit from discharge with outpatient f/u.          Final Clinical Impression(s) / ED Diagnoses Final diagnoses:  Viral upper respiratory tract infection    Rx / DC Orders ED Discharge Orders          Ordered    fluticasone (FLONASE) 50 MCG/ACT nasal spray  Daily        05/21/22 1752               [name], MD 05/21/22 1755

## 2022-05-24 ENCOUNTER — Encounter (HOSPITAL_COMMUNITY): Admission: RE | Payer: Self-pay | Source: Ambulatory Visit

## 2022-05-24 ENCOUNTER — Ambulatory Visit (HOSPITAL_COMMUNITY): Admission: RE | Admit: 2022-05-24 | Payer: Medicare HMO | Source: Ambulatory Visit | Admitting: Gastroenterology

## 2022-05-24 LAB — HM COLONOSCOPY

## 2022-05-24 SURGERY — ESOPHAGOGASTRODUODENOSCOPY (EGD) WITH PROPOFOL
Anesthesia: Monitor Anesthesia Care

## 2022-05-25 ENCOUNTER — Encounter (INDEPENDENT_AMBULATORY_CARE_PROVIDER_SITE_OTHER): Payer: Self-pay | Admitting: *Deleted

## 2022-05-31 ENCOUNTER — Emergency Department (HOSPITAL_COMMUNITY)
Admission: EM | Admit: 2022-05-31 | Discharge: 2022-05-31 | Disposition: A | Discharge status: Home / Self Care | Payer: Medicare HMO | Attending: Emergency Medicine | Admitting: Emergency Medicine

## 2022-05-31 ENCOUNTER — Other Ambulatory Visit: Payer: Self-pay

## 2022-05-31 ENCOUNTER — Encounter (HOSPITAL_COMMUNITY): Payer: Self-pay | Admitting: *Deleted

## 2022-05-31 DIAGNOSIS — M79621 Pain in right upper arm: Secondary | ICD-10-CM | POA: Insufficient documentation

## 2022-05-31 DIAGNOSIS — M79601 Pain in right arm: Secondary | ICD-10-CM | POA: Diagnosis present

## 2022-05-31 NOTE — ED Triage Notes (Signed)
Pt in c/o R armpit pain onset x 3-4 days ago, pt states, "it got better when I stopped using deodorant and used alcohol" no obvious swelling or rash to the area, pt denies CP & SOB, n/v/d, A&O x4

## 2022-05-31 NOTE — ED Provider Notes (Signed)
[facility] EMERGENCY DEPARTMENT Provider Note   CSN: [id number] Arrival date & time: 05/31/22  0846     History Chief Complaint  Patient presents with   Arm Pain    Under arm pain    HPI Candace [name] is a 76 y.o. female presenting for chief complaint of right armpit pain.  States that she had a pinpoint swelling and tenderness to palpation of her right armpit over the last 2 days.  Denies fevers or chills, nausea vomiting, syncope shortness of breath.  States that she called her PCP's office who told her she needs to be evaluated for shingles.  She is otherwise ambulatory tolerating p.o. intake.  Denies any rash   Patient's recorded medical, surgical, social, medication list and allergies were reviewed in the Snapshot window as part of the initial history.   Review of Systems   Review of Systems  Constitutional:  Negative for chills and fever.  HENT:  Negative for ear pain and sore throat.   Eyes:  Negative for pain and visual disturbance.  Respiratory:  Negative for cough and shortness of breath.   Cardiovascular:  Negative for chest pain and palpitations.  Gastrointestinal:  Negative for abdominal pain and vomiting.  Genitourinary:  Negative for dysuria and hematuria.  Musculoskeletal:  Negative for arthralgias and back pain.  Skin:  Negative for color change and rash.  Neurological:  Negative for seizures and syncope.  All other systems reviewed and are negative.   Physical Exam Updated Vital Signs BP 127/65   Pulse (!) 57   Temp 98.3 F (36.8 C) (Oral)   SpO2 100%  Physical Exam Vitals and nursing note reviewed.  Constitutional:      General: She is not in acute distress.    Appearance: She is well-developed.  HENT:     Head: Normocephalic and atraumatic.  Eyes:     Conjunctiva/sclera: Conjunctivae normal.  Cardiovascular:     Rate and Rhythm: Normal rate and regular rhythm.     Heart sounds: No murmur heard. Pulmonary:     Effort: Pulmonary effort is  normal. No respiratory distress.     Breath sounds: Normal breath sounds.  Abdominal:     General: There is no distension.     Palpations: Abdomen is soft.     Tenderness: There is no abdominal tenderness. There is left CVA tenderness. There is no right CVA tenderness.  Musculoskeletal:        General: Deformity (Swollen tender lymphadenopathy of the right underarm.  Full range of motion.  No other cellulitis or rash appreciated.) present. No swelling or tenderness. Normal range of motion.     Cervical back: Neck supple.  Skin:    General: Skin is warm and dry.     Findings: No erythema.  Neurological:     General: No focal deficit present.     Mental Status: She is alert and oriented to person, place, and time. Mental status is at baseline.     Cranial Nerves: No cranial nerve deficit.      ED Course/ Medical Decision Making/ A&P    Procedures Procedures   Medications Ordered in ED Medications - No data to display  Medical Decision Making:    [name] is a 76 y.o. female who presented to the ED today with right armpit pain detailed above.     Patient's presentation is complicated by their history of multiple comorbid medical problem.  Patient placed on continuous vitals and telemetry monitoring while  in ED which was reviewed periodically.   Complete initial physical exam performed, notably the patient  was HDS in NAD.      Reviewed and confirmed nursing documentation for past medical history, family history, social history.    Initial Assessment:   Patient's HPI and PE findings are most consistent with nonspecific lymphadenopathy.  Do not favor bacterial lymphadenitis nor shingles based on lack of overlying erythema redness, rash and only mild nature of pain. Ultimately, patient stable for outpatient care management.  No indication for antibiotics nor any other acute interventions.  I recommended blood work to ensure no systemic cause of lymphadenopathy but patient  states she had blood work done 2 weeks ago and does not want to repeat it unless absolute necessary.  Given the mild nature of her symptoms, nonspecific etiology, I believe it is reasonable to hold off at this time.  Will plan to patient reassessed by PCP within 48 hours for development of any changes.  Patient otherwise stable for outpatient care management.  Clinical Impression:  1. Pain in right axilla      Discharge   Final Clinical Impression(s) / ED Diagnoses Final diagnoses:  Pain in right axilla    Rx / DC Orders ED Discharge Orders     None         Tretha Sciara, MD 05/31/22 1545

## 2022-06-23 ENCOUNTER — Other Ambulatory Visit (HOSPITAL_COMMUNITY): Payer: Self-pay | Admitting: Family Medicine

## 2022-06-23 DIAGNOSIS — Z13820 Encounter for screening for osteoporosis: Secondary | ICD-10-CM

## 2022-07-30 ENCOUNTER — Other Ambulatory Visit (INDEPENDENT_AMBULATORY_CARE_PROVIDER_SITE_OTHER): Payer: Self-pay | Admitting: Gastroenterology

## 2022-08-08 ENCOUNTER — Ambulatory Visit (INDEPENDENT_AMBULATORY_CARE_PROVIDER_SITE_OTHER): Payer: Medicare HMO | Admitting: Gastroenterology

## 2022-09-26 ENCOUNTER — Ambulatory Visit (INDEPENDENT_AMBULATORY_CARE_PROVIDER_SITE_OTHER): Payer: Medicare HMO | Admitting: Gastroenterology

## 2022-09-29 ENCOUNTER — Ambulatory Visit (INDEPENDENT_AMBULATORY_CARE_PROVIDER_SITE_OTHER): Payer: Medicare HMO | Admitting: Gastroenterology

## 2022-09-29 ENCOUNTER — Telehealth (INDEPENDENT_AMBULATORY_CARE_PROVIDER_SITE_OTHER): Payer: Self-pay | Admitting: Gastroenterology

## 2022-09-29 ENCOUNTER — Encounter (INDEPENDENT_AMBULATORY_CARE_PROVIDER_SITE_OTHER): Payer: Self-pay | Admitting: Gastroenterology

## 2022-09-29 VITALS — BP 113/71 | HR 71 | Temp 98.5°F | Ht 64.0 in | Wt 115.5 lb

## 2022-09-29 DIAGNOSIS — R634 Abnormal weight loss: Secondary | ICD-10-CM | POA: Diagnosis not present

## 2022-09-29 DIAGNOSIS — K5900 Constipation, unspecified: Secondary | ICD-10-CM | POA: Diagnosis not present

## 2022-09-29 DIAGNOSIS — K2100 Gastro-esophageal reflux disease with esophagitis, without bleeding: Secondary | ICD-10-CM

## 2022-09-29 NOTE — Progress Notes (Addendum)
Referring Provider: [facility] Primary Care Physician:  [facility] Primary GI Physician: Levon Hedger   Chief Complaint  Patient presents with   Weight Loss    Follow up on weight loss. Weight in October 120 lb and today 115 lbs. Usually eats breaksfast and dinner ans some snacks in between.    Constipation    Follow up on constipation. Reports it is much better since eating more vegetables.    HPI:   Candace [name] is a 77 y.o. female with past medical history of  HTN, GERD, constipation, erosive esophagitis, gastric ulcer, leiomyoma.   Patient presenting today for follow up of weight loss and constipation   Last seen October 2023, at that time  doing 1 capful of Miralax per day and 1 colace per day, having a BM every other day. Notes that stools are still somewhat hard and require straining at times.  She endorses some bloating at times that gets better with a BM. She is down about 9 pounds since her last visit. She notes that she recently had to start a medication for anxiety and wonders if this has effected her appetite. Denies rectal bleeding or melena. She denies nausea or vomiting. She notes some issues finishing meals at one sitting, sometimes will have to come back and finish later on though she denies any overt abdominal pain. 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 Notably had ER visit in June, KUB showed moderate colonic stool burden. Patient is requesting to have cologuard ordered. States this will give her peace of mind.   Recommended to increase miralax to BID, consider linzess/amitiza if no improvement, EGD for further evaluation of early satiety and weight loss. EGD was cancelled, cologuard negative.  Present:  down to 115 lbs, 15 lb weight loss over the past 7 months.   She notes constipation has improved with increasing fruits, veggies and water. She is not taking miralax at this time. She  is having a BM maybe every 3 days, occasionally will have to strain but she feels that she is moving her bowels sufficiently. She has some abdominal pain in lower abdomen if she eats something greasy. She notes improvement with abdominal discomfort if she moves her bowels. She notes that EGD had to be cancelled as she had some pain in her chest/epigastric region, though she notes cardiac workup for this was unrevealing. She continues to have some pressure in her chest/epigastric area though she is not sure how often or what precipitates it, she does not feel that any certain foods bring it on. She denies belching.   She denies any other dietary changes. She is eating snacks in between her meals.  Denies rectal bleeding or melena. She denies early satiety though she states she does not eat much, will usually eat a meat and a vegetable at a meat, she has cut out greasy, fried foods, corn bread and white breads, does only wheat bread. She has some issues with GERD if she eats certain foods, she is taking famotidine  daily which seems to keep her symptoms controlled. Denies nausea or vomiting.    Last Colonoscopy:(2018)diverticulosis throughout colon, internal hemorrhoids, no polyps Last Endoscopy: (2013) patchy whitish exudate coating mucosa of distal half of the esophagus (confirmed candida). Spontaneous reflux of bile noted in distal esophagus. Small sliding HH. Small gastric pouch with patent B1 anastomosis, 7mm gastric polyp (hyperplastic gastric polyp).  Recommendations:  No further colonoscopy recommended given patient's advanced age, unless otherwise clinically indicated  Past Medical History:  Diagnosis Date   Diverticulosis    Erosive esophagitis    GERD (gastroesophageal reflux disease)    Hypertension    Hypertension     Past Surgical History:  Procedure Laterality Date   ABDOMINAL HYSTERECTOMY     ABDOMINAL SURGERY     APPENDECTOMY     COLONOSCOPY N/A 06/16/2016    rehman:diverticulosis throughout colon, internal hemorrhoids, no polyps   ESOPHAGOGASTRODUODENOSCOPY  11/10/2011   Re[facility]:patchy whitish exudate coating mucosa of distal half of the esophagus (confirmed candida). Spontaneous reflux of bile noted in distal esophagus. Small sliding HH. Small gastric pouch with patent B1 anastomosis, 7mm gastric polyp (hyperplastic gastric polyp).   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     bismuth subsalicylate (PEPTO BISMOL) 262 MG/15ML suspension Take 30 mLs by mouth every 6 (six) hours as needed.     cyclobenzaprine (FLEXERIL) 5 MG tablet Take 5 mg by mouth at bedtime.     famotidine (PEPCID) 20 MG tablet TAKE ONE TABLET BY MOUTH ONCE DAILY 90 tablet 0   fluticasone (FLONASE) 50 MCG/ACT nasal spray Place 1 spray into both nostrils daily. 15.8 mL 0   hydrochlorothiazide (HYDRODIURIL) 25 MG tablet Take 25 mg by mouth daily.     losartan (COZAAR) 50 MG tablet Take 50 mg by mouth daily.     meclizine (ANTIVERT) 25 MG tablet Take 1 tablet (25 mg total) by mouth 3 (three) times daily as needed for dizziness. 30 tablet 0   Multiple Vitamin (MULTIVITAMIN WITH MINERALS) TABS tablet Take 1 tablet by mouth daily.     nabumetone (RELAFEN) 500 MG tablet Take 500 mg by mouth 2 (two) times daily.     nitrofurantoin, macrocrystal-monohydrate, (MACROBID) 100 MG capsule Take 100 mg by mouth 2 (two) times daily.     potassium chloride SA (KLOR-CON M) 20 MEQ tablet Take 20 mEq by mouth daily.     rosuvastatin (CRESTOR) 10 MG tablet Take 10 mg by mouth daily.     cloNIDine (CATAPRES) 0.1 MG tablet Take 0.1 mg by mouth at bedtime. (Patient not taking: Reported on 09/29/2022)     docusate sodium (COLACE) 100 MG capsule Take 100 mg by mouth 2 (two) times daily as needed  for mild constipation. (Patient not taking: Reported on 09/29/2022)     mirtazapine (REMERON) 7.5 MG tablet Take 7.5 mg by mouth at bedtime. (Patient not taking: Reported on 09/29/2022)     OLANZapine (ZYPREXA) 5 MG tablet Take 5 mg by mouth at bedtime. (Patient not taking: Reported on 09/29/2022)     No current facility-administered medications for this visit.    Allergies as of 09/29/2022 - Review Complete 09/29/2022  Allergen Reaction Noted   Diclofenac sodium Rash 07/31/2013   Lisinopril Anaphylaxis 05/18/2022   Aspirin Itching and Swelling 02/21/2011   Penicillins Hives, Itching, and Swelling 02/21/2011    No family history on file.  Social History   Socioeconomic History   Marital status: Single    Spouse name: Not on file   Number of children: Not on file   Years of education: Not on file   Highest education level: Not on file  Occupational History   Not on file  Tobacco Use   Smoking status: Never    Passive exposure: Never   Smokeless tobacco: Former  Building services engineer Use: Never used  Substance and Sexual Activity   Alcohol use: No   Drug use: No   Sexual activity: Not on file  Other Topics Concern   Not on file  Social History Narrative   Not on file   Social Determinants of Health   Financial Resource Strain: Not on file  Food Insecurity: Not on file  Transportation Needs: Not on file  Physical Activity: Not on file  Stress: Not on file  Social Connections: Not on file   Review of systems General: negative for malaise, night sweats, fever, chills, weight loss Neck: Negative for lumps, goiter, pain and significant neck swelling Resp: Negative for cough, wheezing, dyspnea at rest CV: Negative for chest pain, leg swelling, palpitations, orthopnea GI: denies melena, hematochezia, nausea, vomiting, diarrhea, constipation, dysphagia, odyonophagia, early satiety or unintentional weight loss. +epigastric pain  MSK: Negative for joint pain or swelling, back  pain, and muscle pain. Derm: Negative for itching or rash Psych: Denies depression, anxiety, memory loss, confusion. No homicidal or suicidal ideation.  Heme: Negative for prolonged bleeding, bruising easily, and swollen nodes. Endocrine: Negative for cold or heat intolerance, polyuria, polydipsia and goiter. Neuro: negative for tremor, gait imbalance, syncope and seizures. The remainder of the review of systems is noncontributory.  Physical Exam: BP 113/71 (BP Location: Left Arm, Patient Position: Sitting, Cuff Size: Normal)   Pulse 71   Temp 98.5 F (36.9 C) (Oral)   Ht 5\' 4"  (1.626 m)   Wt 115 lb 8 oz (52.4 kg)   BMI 19.83 kg/m  General:   Alert and oriented. No distress noted. Pleasant and cooperative.  Head:  Normocephalic and atraumatic. Eyes:  Conjuctiva clear without scleral icterus. Mouth:  Oral mucosa pink and moist. Good dentition. No lesions. Heart: Normal rate and rhythm, s1 and s2 heart sounds present.  Lungs: Clear lung sounds in all lobes. Respirations equal and unlabored. Abdomen:  +BS, soft, non-tender and non-distended. No rebound or guarding. No HSM or masses noted. Derm: No palmar erythema or jaundice Msk:  Symmetrical without gross deformities. Normal posture. Extremities:  Without edema. Neurologic:  Alert and  oriented x4 Psych:  Alert and cooperative. Normal mood and affect.  Invalid input(s): "6 MONTHS"   ASSESSMENT: Candace [name] is a 77 y.o. female presenting today for constipation and weight loss  Constipation: improved with dietary changes, she is having a BM mostly every 3 days but feels that she is emptying her bowels sufficiently, stools are rarely hard, she has not needed miralax PRN for a while. She denies rectal bleeding or melena. Encouraged to continue with dietary changes and good water intake.  Weight loss: continues to lose weight, down 15 lbs since August 2023, previously with early satiety though she declines this today, she tells me  she only eats very small meals. She has cut out some fried foods and breads which may have contributed to her weight loss, interestingly enough she notes some chest/epigastric pain which is why her EGD previously recommended was cancelled. She denies nausea or vomiting. Pepcid controls her GERD relatively well. I am still concerned about her weight loss, given chest/epigastric pain, would query if there is possibly some gastritis/PUD, she also has history of candida esophagitis, therefore cannot rule these out as differentials. She denies dysphagia. I recommend continuing with  plan as previously and scheduling EGD which I discussed with the patient. Indications, risks and benefits of procedure discussed in detail with patient. Patient verbalized understanding and is in agreement to proceed with EGD.   PLAN:  Schedule EGD ASA III 2. Continue with pepcid 20mg  Daily  3. Continue with good water intake, diet high in fruits, veggies and whole grains  All questions were answered, patient verbalized understanding and is in agreement with plan as outlined above.   Follow Up: 3 months   Therma Lasure L. Jeanmarie Hubert, MSN, APRN, AGNP-C Adult-Gerontology Nurse Practitioner [facility]  I have reviewed the note and agree with the APP's assessment as described in this progress note  Katrinka Blazing, MD Gastroenterology and Hepatology [facility] Gastroenterology

## 2022-09-29 NOTE — Patient Instructions (Signed)
We will Schedule upper endoscopy for further evaluation of your weight loss and chest/epigastric pain  Continue with pepcid  Daily and avoiding foods that tend to cause you issues  Increase water intake, aim for atleast 64 oz per day Increase fruits, veggies and whole grains, kiwi and prunes are especially good for constipation  Follow up 3 months

## 2022-09-29 NOTE — Telephone Encounter (Signed)
[name], Candace [name], Candace Lucien, LPN There is no room on this day for this patient.  It would not start until 3:00.

## 2022-09-29 NOTE — Telephone Encounter (Signed)
PA approved via [employer] for EGD Approved Authorization #[id number]  Tracking #[id number]  Dates of service 09/29/2022 - 12/29/2022

## 2022-09-30 ENCOUNTER — Encounter (INDEPENDENT_AMBULATORY_CARE_PROVIDER_SITE_OTHER): Payer: Self-pay

## 2022-09-30 NOTE — Telephone Encounter (Signed)
Pt contacted and EDG rescheduled to 11/22/22 at 11:00am. Updated instructions will be sent to pt via mail. Will call pt with pre op appt.

## 2022-10-17 ENCOUNTER — Encounter (HOSPITAL_COMMUNITY): Payer: Self-pay

## 2022-10-17 ENCOUNTER — Emergency Department (HOSPITAL_COMMUNITY): Payer: Medicare HMO

## 2022-10-17 ENCOUNTER — Other Ambulatory Visit: Payer: Self-pay

## 2022-10-17 ENCOUNTER — Emergency Department (HOSPITAL_COMMUNITY)
Admission: EM | Admit: 2022-10-17 | Discharge: 2022-10-17 | Disposition: A | Discharge status: Home / Self Care | Payer: Medicare HMO | Attending: Emergency Medicine | Admitting: Emergency Medicine

## 2022-10-17 DIAGNOSIS — K219 Gastro-esophageal reflux disease without esophagitis: Secondary | ICD-10-CM | POA: Insufficient documentation

## 2022-10-17 DIAGNOSIS — R109 Unspecified abdominal pain: Secondary | ICD-10-CM | POA: Diagnosis not present

## 2022-10-17 DIAGNOSIS — A028 Other specified salmonella infections: Secondary | ICD-10-CM | POA: Diagnosis not present

## 2022-10-17 DIAGNOSIS — A09 Infectious gastroenteritis and colitis, unspecified: Secondary | ICD-10-CM | POA: Insufficient documentation

## 2022-10-17 DIAGNOSIS — B9620 Unspecified Escherichia coli [E. coli] as the cause of diseases classified elsewhere: Secondary | ICD-10-CM | POA: Diagnosis not present

## 2022-10-17 DIAGNOSIS — R682 Dry mouth, unspecified: Secondary | ICD-10-CM | POA: Insufficient documentation

## 2022-10-17 DIAGNOSIS — E876 Hypokalemia: Secondary | ICD-10-CM | POA: Insufficient documentation

## 2022-10-17 DIAGNOSIS — R197 Diarrhea, unspecified: Secondary | ICD-10-CM

## 2022-10-17 DIAGNOSIS — I10 Essential (primary) hypertension: Secondary | ICD-10-CM | POA: Diagnosis not present

## 2022-10-17 DIAGNOSIS — Z79899 Other long term (current) drug therapy: Secondary | ICD-10-CM | POA: Diagnosis not present

## 2022-10-17 DIAGNOSIS — E860 Dehydration: Secondary | ICD-10-CM | POA: Diagnosis not present

## 2022-10-17 DIAGNOSIS — R000 Tachycardia, unspecified: Secondary | ICD-10-CM | POA: Diagnosis not present

## 2022-10-17 LAB — CBC WITH DIFFERENTIAL/PLATELET
Abs Immature Granulocytes: 0.01 10*3/uL (ref 0.00–0.07)
Basophils Absolute: 0 10*3/uL (ref 0.0–0.1)
Basophils Relative: 0 %
Eosinophils Absolute: 0 10*3/uL (ref 0.0–0.5)
Eosinophils Relative: 0 %
HCT: 37.4 % (ref 36.0–46.0)
Hemoglobin: 12.3 g/dL (ref 12.0–15.0)
Immature Granulocytes: 0 %
Lymphocytes Relative: 7 %
Lymphs Abs: 0.5 10*3/uL — ABNORMAL LOW (ref 0.7–4.0)
MCH: 30.1 pg (ref 26.0–34.0)
MCHC: 32.9 g/dL (ref 30.0–36.0)
MCV: 91.7 fL (ref 80.0–100.0)
Monocytes Absolute: 0.7 10*3/uL (ref 0.1–1.0)
Monocytes Relative: 11 %
Neutro Abs: 5.6 10*3/uL (ref 1.7–7.7)
Neutrophils Relative %: 82 %
Platelets: 195 10*3/uL (ref 150–400)
RBC: 4.08 MIL/uL (ref 3.87–5.11)
RDW: 12.4 % (ref 11.5–15.5)
WBC: 6.9 10*3/uL (ref 4.0–10.5)
nRBC: 0 % (ref 0.0–0.2)

## 2022-10-17 LAB — URINALYSIS, ROUTINE W REFLEX MICROSCOPIC
Bilirubin Urine: NEGATIVE
Glucose, UA: NEGATIVE mg/dL
Ketones, ur: NEGATIVE mg/dL
Leukocytes,Ua: NEGATIVE
Nitrite: POSITIVE — AB
Protein, ur: 30 mg/dL — AB
Specific Gravity, Urine: 1.011 (ref 1.005–1.030)
pH: 5 (ref 5.0–8.0)

## 2022-10-17 LAB — COMPREHENSIVE METABOLIC PANEL
ALT: 27 U/L (ref 0–44)
AST: 38 U/L (ref 15–41)
Albumin: 3.8 g/dL (ref 3.5–5.0)
Alkaline Phosphatase: 82 U/L (ref 38–126)
Anion gap: 11 (ref 5–15)
BUN: 16 mg/dL (ref 8–23)
CO2: 27 mmol/L (ref 22–32)
Calcium: 8.8 mg/dL — ABNORMAL LOW (ref 8.9–10.3)
Chloride: 97 mmol/L — ABNORMAL LOW (ref 98–111)
Creatinine, Ser: 1.11 mg/dL — ABNORMAL HIGH (ref 0.44–1.00)
GFR, Estimated: 51 mL/min — ABNORMAL LOW (ref >60)
Glucose, Bld: 126 mg/dL — ABNORMAL HIGH (ref 70–99)
Potassium: 2.5 mmol/L — CL (ref 3.5–5.1)
Sodium: 135 mmol/L (ref 135–145)
Total Bilirubin: 0.8 mg/dL (ref 0.3–1.2)
Total Protein: 8 g/dL (ref 6.5–8.1)

## 2022-10-17 LAB — LIPASE, BLOOD: Lipase: 23 U/L (ref 11–51)

## 2022-10-17 LAB — C DIFFICILE QUICK SCREEN W PCR REFLEX
C Diff antigen: NEGATIVE
C Diff interpretation: NOT DETECTED
C Diff toxin: NEGATIVE

## 2022-10-17 LAB — MAGNESIUM: Magnesium: 1.9 mg/dL (ref 1.7–2.4)

## 2022-10-17 MED ORDER — POTASSIUM CHLORIDE 10 MEQ/100ML IV SOLN
10.0000 meq | Freq: Once | INTRAVENOUS | Status: AC
  Administered 2022-10-17: 10 meq via INTRAVENOUS
  Filled 2022-10-17: qty 100

## 2022-10-17 MED ORDER — SODIUM CHLORIDE 0.9 % IV BOLUS
500.0000 mL | Freq: Once | INTRAVENOUS | Status: AC
  Administered 2022-10-17: 500 mL via INTRAVENOUS

## 2022-10-17 MED ORDER — POTASSIUM CHLORIDE CRYS ER 20 MEQ PO TBCR
40.0000 meq | EXTENDED_RELEASE_TABLET | Freq: Once | ORAL | Status: AC
  Administered 2022-10-17: 40 meq via ORAL
  Filled 2022-10-17: qty 2

## 2022-10-17 NOTE — ED Triage Notes (Signed)
Patient woke up this morning and had 2 episodes of diarrhea. She denies any abdominal pain or and blood in her stool

## 2022-10-17 NOTE — ED Provider Notes (Signed)
[place] EMERGENCY DEPARTMENT AT [facility] Provider Note   CSN: [id number] Arrival date & time: 10/17/22  4098     History  Chief Complaint  Patient presents with   Diarrhea    Candace [name] is a 77 y.o. female.  Pt is a 77 yo female with pmhx significant for htn, gerd and constipation.  Pt said she had 2 episodes of diarrhea this am.  She is feeling better now.  No f/c.  No recent abx.  No recent travel.       Home Medications Prior to Admission medications   Medication Sig Start Date End Date Taking? Authorizing Provider  acetaminophen (TYLENOL) 500 MG tablet Take 500 mg by mouth every 6 (six) hours as needed for moderate pain.    [provider]  bismuth subsalicylate (PEPTO BISMOL) 262 MG/15ML suspension Take 30 mLs by mouth every 6 (six) hours as needed.    [provider]  cloNIDine (CATAPRES) 0.1 MG tablet Take 0.1 mg by mouth at bedtime. Patient not taking: Reported on 09/29/2022    [provider]  cyclobenzaprine (FLEXERIL) 5 MG tablet Take 5 mg by mouth at bedtime.    [provider]  docusate sodium (COLACE) 100 MG capsule Take 100 mg by mouth 2 (two) times daily as needed for mild constipation. Patient not taking: Reported on 09/29/2022    [provider]  famotidine (PEPCID) 20 MG tablet TAKE ONE TABLET BY MOUTH ONCE DAILY 08/01/22   Carlan, Chelsea L, NP  fluticasone (FLONASE) 50 MCG/ACT nasal spray Place 1 spray into both nostrils daily. 05/21/22   Jacalyn Lefevre, MD  hydrochlorothiazide (HYDRODIURIL) 25 MG tablet Take 25 mg by mouth daily.    [provider]  losartan (COZAAR) 50 MG tablet Take 50 mg by mouth daily.    [provider]  meclizine (ANTIVERT) 25 MG tablet Take 1 tablet (25 mg total) by mouth 3 (three) times daily as needed for dizziness. 01/17/22   Gilda Crease, MD  mirtazapine (REMERON) 7.5 MG tablet Take 7.5 mg by mouth at bedtime. Patient not taking: Reported  on 09/29/2022    [provider]  Multiple Vitamin (MULTIVITAMIN WITH MINERALS) TABS tablet Take 1 tablet by mouth daily.    [provider]  nabumetone (RELAFEN) 500 MG tablet Take 500 mg by mouth 2 (two) times daily.    [provider]  nitrofurantoin, macrocrystal-monohydrate, (MACROBID) 100 MG capsule Take 100 mg by mouth 2 (two) times daily.    [provider]  OLANZapine (ZYPREXA) 5 MG tablet Take 5 mg by mouth at bedtime. Patient not taking: Reported on 09/29/2022    [provider]  potassium chloride SA (KLOR-CON M) 20 MEQ tablet Take 20 mEq by mouth daily.    [provider]  rosuvastatin (CRESTOR) 10 MG tablet Take 10 mg by mouth daily.    [provider]  divalproex (DEPAKOTE) 250 MG EC tablet Take 250 mg by mouth 2 (two) times daily.    08/29/11  [provider]      Allergies    Diclofenac sodium, Lisinopril, Aspirin, and Penicillins    Review of Systems   Review of Systems  Gastrointestinal:  Positive for diarrhea.  All other systems reviewed and are negative.   Physical Exam Updated Vital Signs BP 117/73   Pulse (!) 105   Temp 98.5 F (36.9 C)   Resp 16   Ht 5\' 4"  (1.626 m)   Wt 52.4 kg  SpO2 100%   BMI 19.83 kg/m  Physical Exam Vitals and nursing note reviewed.  Constitutional:      Appearance: Normal appearance.  HENT:     Head: Normocephalic and atraumatic.     Right Ear: External ear normal.     Left Ear: External ear normal.     Nose: Nose normal.     Mouth/Throat:     Mouth: Mucous membranes are dry.  Eyes:     Extraocular Movements: Extraocular movements intact.     Conjunctiva/sclera: Conjunctivae normal.     Pupils: Pupils are equal, round, and reactive to light.  Cardiovascular:     Rate and Rhythm: Regular rhythm. Tachycardia present.     Pulses: Normal pulses.     Heart sounds: Normal heart sounds.  Pulmonary:     Effort: Pulmonary effort is normal.     Breath sounds:  Normal breath sounds.  Abdominal:     General: Abdomen is flat. Bowel sounds are normal.     Palpations: Abdomen is soft.  Musculoskeletal:        General: Normal range of motion.     Cervical back: Normal range of motion and neck supple.  Skin:    General: Skin is warm.     Capillary Refill: Capillary refill takes less than 2 seconds.  Neurological:     General: No focal deficit present.     Mental Status: She is alert and oriented to person, place, and time.  Psychiatric:        Mood and Affect: Mood normal.        Behavior: Behavior normal.     ED Results / Procedures / Treatments   Labs (all labs ordered are listed, but only abnormal results are displayed) Labs Reviewed  CBC WITH DIFFERENTIAL/PLATELET - Abnormal; Notable for the following components:      Result Value   Lymphs Abs 0.5 (*)    All other components within normal limits  COMPREHENSIVE METABOLIC PANEL - Abnormal; Notable for the following components:   Potassium 2.5 (*)    Chloride 97 (*)    Glucose, Bld 126 (*)    Creatinine, Ser 1.11 (*)    Calcium 8.8 (*)    GFR, Estimated 51 (*)    All other components within normal limits  LIPASE, BLOOD  MAGNESIUM  URINALYSIS, ROUTINE W REFLEX MICROSCOPIC    EKG None  Radiology DG Abdomen Acute W/Chest  Result Date: 10/17/2022 CLINICAL DATA:  Abdominal pain, diarrhea EXAM: DG ABDOMEN ACUTE WITH 1 VIEW CHEST COMPARISON:  Chest radiographs done on 05/21/2022 FINDINGS: Cardiac size is within normal limits. Thoracic aorta is tortuous. There are linear densities in right lower lung field. There are no signs of pulmonary edema. There is no pleural effusion or pneumothorax. There is mild dilation of small-bowel loops in left upper abdomen. There is no pneumoperitoneum. Surgical clips are seen in right upper quadrant of abdomen. No abnormal masses or calcifications are seen. Kidneys are partly obscured by bowel contents. Degenerative changes are noted in lumbar spine with  disc space narrowing and bony spurs at multiple levels. Degenerative changes are noted in both hips. IMPRESSION: There is mild dilation of few small bowel loops in left upper quadrant suggesting possible ileus. There is no evidence of intestinal obstruction or pneumoperitoneum. Linear densities in right lower lung field suggest scarring or subsegmental atelectasis. Electronically Signed   By: Ernie Avena M.D.   On: 10/17/2022 08:09    Procedures Procedures    Medications  Ordered in ED Medications  potassium chloride 10 mEq in 100 mL IVPB (10 mEq Intravenous New Bag/Given 10/17/22 0816)  sodium chloride 0.9 % bolus 500 mL (0 mLs Intravenous Stopped 10/17/22 0844)  potassium chloride SA (KLOR-CON M) CR tablet 40 mEq (40 mEq Oral Given 10/17/22 0815)    ED Course/ Medical Decision Making/ A&P                             Medical Decision Making Amount and/or Complexity of Data Reviewed Labs: ordered. Radiology: ordered.  Risk Prescription drug management.   This patient presents to the ED for concern of diarrhea, this involves an extensive number of treatment options, and is a complaint that carries with it a high risk of complications and morbidity.  The differential diagnosis includes viral or bacterial etiology, constipation, electrolyte abn, infection   Co morbidities that complicate the patient evaluation   htn, gerd and constipation   Additional history obtained:  Additional history obtained from epic chart review  Lab Tests:  I Ordered, and personally interpreted labs.  The pertinent results include:  cbc nl, cmp with k low at 2.5, cr elevated at 1.11 (cr 0.87 on 12/16), lip nl at 23, mg nl at 1.9   Imaging Studies ordered:  I ordered imaging studies including kub3  I independently visualized and interpreted imaging which showed  There is mild dilation of few small bowel loops in left upper  quadrant suggesting possible ileus. There is no evidence of   intestinal obstruction or pneumoperitoneum.    Linear densities in right lower lung field suggest scarring or  subsegmental atelectasis.   I agree with the radiologist interpretation   Cardiac Monitoring:  The patient was maintained on a cardiac monitor.  I personally viewed and interpreted the cardiac monitored which showed an underlying rhythm of: sinus tachy initially; nsr now   Medicines ordered and prescription drug management:  I ordered medication including ivfs  for dehydration; kdur and kcl for hypokalemia Reevaluation of the patient after these medicines showed that the patient improved I have reviewed the patients home medicines and have made adjustments as needed   Test Considered:  ct   Critical Interventions:  K replacement  Problem List / ED Course:  Hypokalemia:  IV Kcl and oral kdur ordered Diarrhea:  pt only had 2 episodes of diarrhea.  She feels well now.  She denies any pain.  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  Pt is stable for d/c.  Return if worse.    Reevaluation:  After the interventions noted above, I reevaluated the patient and found that they have :improved   Social Determinants of Health:  Lives at home with famiily   Dispostion:  After consideration of the diagnostic results and the patients response to treatment, I feel that the patent would benefit from discharge with outpatient f/u.          Final Clinical Impression(s) / ED Diagnoses Final diagnoses:  Diarrhea, unspecified type  Hypokalemia    Rx / DC Orders ED Discharge Orders     None         Jacalyn Lefevre, MD 10/17/22 [phone]

## 2022-10-17 NOTE — Discharge Instructions (Signed)
Foods High In Both Magnesium and Potassium Spinach in particular is an excellent source of both magnesium and potassium. But don't forget fruits (avocado, banana, apple), starchy vegetables (potatoes, yams, carrots), legumes, and meat and fish like chicken, beef, and salmon. 

## 2022-10-18 ENCOUNTER — Encounter (INDEPENDENT_AMBULATORY_CARE_PROVIDER_SITE_OTHER): Payer: Self-pay

## 2022-10-18 ENCOUNTER — Telehealth (HOSPITAL_COMMUNITY): Payer: Self-pay | Admitting: Physician Assistant

## 2022-10-18 LAB — GASTROINTESTINAL PANEL BY PCR, STOOL (REPLACES STOOL CULTURE)
Adenovirus F40/41: NOT DETECTED
Astrovirus: NOT DETECTED
Campylobacter species: NOT DETECTED
Cryptosporidium: NOT DETECTED
Cyclospora cayetanensis: NOT DETECTED
E. coli O157: NOT DETECTED
Entamoeba histolytica: NOT DETECTED
Enteroaggregative E coli (EAEC): NOT DETECTED
Enterotoxigenic E coli (ETEC): DETECTED — AB
Giardia lamblia: NOT DETECTED
Norovirus GI/GII: NOT DETECTED
Plesimonas shigelloides: NOT DETECTED
Rotavirus A: NOT DETECTED
Salmonella species: DETECTED — AB
Sapovirus (I, II, IV, and V): NOT DETECTED
Shiga like toxin producing E coli (STEC): DETECTED — AB
Shigella/Enteroinvasive E coli (EIEC): NOT DETECTED
Vibrio cholerae: NOT DETECTED
Vibrio species: NOT DETECTED
Yersinia enterocolitica: NOT DETECTED

## 2022-10-18 MED ORDER — METRONIDAZOLE 500 MG PO TABS: 500.0000 mg | ORAL_TABLET | Freq: Three times a day (TID) | ORAL | 0 refills | Status: DC

## 2022-10-18 MED ORDER — CIPROFLOXACIN HCL 500 MG PO TABS: 500.0000 mg | ORAL_TABLET | Freq: Two times a day (BID) | ORAL | 0 refills | Status: DC

## 2022-10-18 NOTE — ED Notes (Signed)
1120 hrs  10/18/22 Lab called and stated pt GI PCR was positive for salmonella, ETEC, STEC.  EDP aware and stated she would handle the results with the pt.

## 2022-10-18 NOTE — Telephone Encounter (Signed)
I spoke to Ms. Labus she is feeling better today.  Pt's stool culture grew salmonella, shiga and ecoli.   I spoke with Dr. Levon Hedger who advised cipro 500mg  bid x 10 days.  Metronidazole 500mg  tid for 10 days.  Pt encourage to drink plenty of fluids and follow up with Dr. Levon Hedger

## 2022-10-22 ENCOUNTER — Other Ambulatory Visit (INDEPENDENT_AMBULATORY_CARE_PROVIDER_SITE_OTHER): Payer: Self-pay | Admitting: Gastroenterology

## 2022-10-24 ENCOUNTER — Encounter (HOSPITAL_COMMUNITY): Payer: Self-pay

## 2022-10-24 ENCOUNTER — Other Ambulatory Visit: Payer: Self-pay

## 2022-10-24 ENCOUNTER — Emergency Department (HOSPITAL_COMMUNITY)
Admission: EM | Admit: 2022-10-24 | Discharge: 2022-10-24 | Disposition: A | Discharge status: Home / Self Care | Payer: Medicare HMO | Attending: Emergency Medicine | Admitting: Emergency Medicine

## 2022-10-24 DIAGNOSIS — M79661 Pain in right lower leg: Secondary | ICD-10-CM | POA: Insufficient documentation

## 2022-10-24 DIAGNOSIS — R252 Cramp and spasm: Secondary | ICD-10-CM

## 2022-10-24 LAB — CBC WITH DIFFERENTIAL/PLATELET
Abs Immature Granulocytes: 0.05 10*3/uL (ref 0.00–0.07)
Basophils Absolute: 0 10*3/uL (ref 0.0–0.1)
Basophils Relative: 1 %
Eosinophils Absolute: 0 10*3/uL (ref 0.0–0.5)
Eosinophils Relative: 1 %
HCT: 37.2 % (ref 36.0–46.0)
Hemoglobin: 12.4 g/dL (ref 12.0–15.0)
Immature Granulocytes: 1 %
Lymphocytes Relative: 39 %
Lymphs Abs: 1.6 10*3/uL (ref 0.7–4.0)
MCH: 29.9 pg (ref 26.0–34.0)
MCHC: 33.3 g/dL (ref 30.0–36.0)
MCV: 89.6 fL (ref 80.0–100.0)
Monocytes Absolute: 0.7 10*3/uL (ref 0.1–1.0)
Monocytes Relative: 17 %
Neutro Abs: 1.7 10*3/uL (ref 1.7–7.7)
Neutrophils Relative %: 41 %
Platelets: 297 10*3/uL (ref 150–400)
RBC: 4.15 MIL/uL (ref 3.87–5.11)
RDW: 12.1 % (ref 11.5–15.5)
WBC: 4.1 10*3/uL (ref 4.0–10.5)
nRBC: 0 % (ref 0.0–0.2)

## 2022-10-24 LAB — BASIC METABOLIC PANEL
Anion gap: 10 (ref 5–15)
BUN: 5 mg/dL — ABNORMAL LOW (ref 8–23)
CO2: 30 mmol/L (ref 22–32)
Calcium: 9 mg/dL (ref 8.9–10.3)
Chloride: 95 mmol/L — ABNORMAL LOW (ref 98–111)
Creatinine, Ser: 0.81 mg/dL (ref 0.44–1.00)
GFR, Estimated: >60 mL/min (ref >60)
Glucose, Bld: 131 mg/dL — ABNORMAL HIGH (ref 70–99)
Potassium: 3.3 mmol/L — ABNORMAL LOW (ref 3.5–5.1)
Sodium: 135 mmol/L (ref 135–145)

## 2022-10-24 NOTE — ED Triage Notes (Signed)
Pt arrived via POV from home c/o left leg cramping. Pt reports after walking on her leg and getting to treatment room in [facility] her leg is not bothering her anymore. Pt reports recently starting her K+ pills that she was prescribed and is wondering if her K+ pills are causing her leg cramping.

## 2022-10-24 NOTE — Discharge Instructions (Signed)
Follow-up with primary doctor if symptoms persist, and return to the ER if symptoms significantly worsen or change. 

## 2022-10-24 NOTE — ED Provider Notes (Signed)
[name] EMERGENCY DEPARTMENT AT [facility] Provider Note   CSN: [id number] Arrival date & time: 10/24/22  0050     History  Chief Complaint  Patient presents with   cramping    Candace [name] is a 77 y.o. female.  Patient is a 77 year old female presenting with complaints of right calf pain.  This started as she was laying down to go to bed.  She describes a cramping in the calf that seem to improved after she got up and walked around.  She was recently started on potassium replacement for a reported potassium of 2.3.  She is concerned these pills may be causing her problem.  She denies any pain at present.  She denies any chest pain or shortness of breath.  The history is provided by the patient.       Home Medications Prior to Admission medications   Medication Sig Start Date End Date Taking? Authorizing Provider  potassium chloride SA (KLOR-CON M) 20 MEQ tablet Take 20 mEq by mouth daily.   Yes [provider]  acetaminophen (TYLENOL) 500 MG tablet Take 500 mg by mouth every 6 (six) hours as needed for moderate pain.    [provider]  bismuth subsalicylate (PEPTO BISMOL) 262 MG/15ML suspension Take 30 mLs by mouth every 6 (six) hours as needed.    [provider]  ciprofloxacin (CIPRO) 500 MG tablet Take 1 tablet (500 mg total) by mouth 2 (two) times daily. 10/18/22   Elson Areas, PA-C  cloNIDine (CATAPRES) 0.1 MG tablet Take 0.1 mg by mouth at bedtime. Patient not taking: Reported on 09/29/2022    [provider]  cyclobenzaprine (FLEXERIL) 5 MG tablet Take 5 mg by mouth at bedtime.    [provider]  docusate sodium (COLACE) 100 MG capsule Take 100 mg by mouth 2 (two) times daily as needed for mild constipation. Patient not taking: Reported on 09/29/2022    [provider]  famotidine (PEPCID) 20 MG tablet TAKE ONE TABLET BY MOUTH ONCE DAILY 08/01/22   Carlan, Chelsea L, NP  fluticasone (FLONASE) 50  MCG/ACT nasal spray Place 1 spray into both nostrils daily. 05/21/22   Jacalyn Lefevre, MD  hydrochlorothiazide (HYDRODIURIL) 25 MG tablet Take 25 mg by mouth daily.    [provider]  losartan (COZAAR) 50 MG tablet Take 50 mg by mouth daily.    [provider]  meclizine (ANTIVERT) 25 MG tablet Take 1 tablet (25 mg total) by mouth 3 (three) times daily as needed for dizziness. 01/17/22   Gilda Crease, MD  metroNIDAZOLE (FLAGYL) 500 MG tablet Take 1 tablet (500 mg total) by mouth 3 (three) times daily. 10/18/22   Elson Areas, PA-C  mirtazapine (REMERON) 7.5 MG tablet Take 7.5 mg by mouth at bedtime. Patient not taking: Reported on 09/29/2022    [provider]  Multiple Vitamin (MULTIVITAMIN WITH MINERALS) TABS tablet Take 1 tablet by mouth daily.    [provider]  nabumetone (RELAFEN) 500 MG tablet Take 500 mg by mouth 2 (two) times daily.    [provider]  nitrofurantoin, macrocrystal-monohydrate, (MACROBID) 100 MG capsule Take 100 mg by mouth 2 (two) times daily.    [provider]  OLANZapine (ZYPREXA) 5 MG tablet Take 5 mg by mouth at bedtime. Patient not taking: Reported on 09/29/2022    [provider]  rosuvastatin (CRESTOR) 10 MG tablet Take 10 mg by mouth daily.    [provider]  divalproex (DEPAKOTE) 250 MG EC tablet Take 250 mg by mouth 2 (two) times daily.    08/29/11  [provider]      Allergies    Diclofenac sodium, Lisinopril, Aspirin, and Penicillins    Review of Systems   Review of Systems  All other systems reviewed and are negative.   Physical Exam Updated Vital Signs BP 109/80 (BP Location: Left Arm)   Pulse 92   Temp 98.6 F (37 C) (Oral)   Resp 16   Ht 5\' 4"  (1.626 m)   Wt 52.3 kg   SpO2 96%   BMI 19.79 kg/m  Physical Exam Vitals and nursing note reviewed.  Constitutional:      General: She is not in acute distress.    Appearance: She is well-developed.  She is not diaphoretic.  HENT:     Head: Normocephalic and atraumatic.  Cardiovascular:     Rate and Rhythm: Normal rate and regular rhythm.     Heart sounds: No murmur heard.    No friction rub. No gallop.  Pulmonary:     Effort: Pulmonary effort is normal. No respiratory distress.     Breath sounds: Normal breath sounds. No wheezing.  Abdominal:     General: Bowel sounds are normal. There is no distension.     Palpations: Abdomen is soft.     Tenderness: There is no abdominal tenderness.  Musculoskeletal:        General: Normal range of motion.     Cervical back: Normal range of motion and neck supple.     Comments: The right calf is normal in appearance.  There is no tenderness.  She has full range of motion of the knee and ankle with no discomfort.  Denna Haggard' sign is absent.  Skin:    General: Skin is warm and dry.  Neurological:     General: No focal deficit present.     Mental Status: She is alert and oriented to person, place, and time.     ED Results / Procedures / Treatments   Labs (all labs ordered are listed, but only abnormal results are displayed) Labs Reviewed - No data to display  EKG None  Radiology No results found.  Procedures Procedures    Medications Ordered in ED Medications - No data to display  ED Course/ Medical Decision Making/ A&P  Patient presenting here with complaints of cramping in her calf as described in the HPI.  She arrives here with stable vital signs and physical examination that is unremarkable.  Symptoms resolved prior to arrival.  Laboratory studies obtained including CBC and metabolic panel.  All studies basically unremarkable.  Potassium is 3.3 which is up from 2.5.  I doubt hypokalemia as the cause.  As patient is feeling better, I feel as though she can safely be discharged.  To return as needed for any problems.  Final Clinical Impression(s) / ED Diagnoses Final diagnoses:  None    Rx / DC Orders ED Discharge Orders      None         Geoffery Lyons, MD 10/24/22 0300

## 2022-11-16 NOTE — Patient Instructions (Signed)
Candace [name]  11/16/2022     @PREFPERIOPPHARMACY @   Your procedure is scheduled on  11/22/2022.   Report to [facility] at  0900 A.M.  Call this number if you have problems the morning of surgery:  [phone]  If you experience any cold or flu symptoms such as cough, fever, chills, shortness of breath, etc. between now and your scheduled surgery, please notify us at the above number.   Remember:  Follow the diet instructions given to you by the office.     Take these medicines the morning of surgery with A SIP OF WATER                           famotidine, antivert(if needed)     Do not wear jewelry, make-up or nail polish, including gel polish,  artificial nails, or any other type of covering on natural nails (fingers and  toes).  Do not wear lotions, powders, or perfumes, or deodorant.  Do not shave 48 hours prior to surgery.  Men may shave face and neck.  Do not bring valuables to the hospital.  [facility] is not responsible for any belongings or valuables.  Contacts, dentures or bridgework may not be worn into surgery.  Leave your suitcase in the car.  After surgery it may be brought to your room.  For patients admitted to the hospital, discharge time will be determined by your treatment team.  Patients discharged the day of surgery will not be allowed to drive home and must have someone with them for 24 hours.    Special instructions:   DO NOT smoke tobacco or vape for 24 hours.  Please read over the following fact sheets that you were given. Anesthesia Post-op Instructions and Care and Recovery After Surgery       Upper Endoscopy, Adult, Care After After the procedure, it is common to have a sore throat. It is also common to have: Mild stomach pain or discomfort. Bloating. Nausea. Follow these instructions at home: The instructions below may help you care for yourself at home. Your health care provider may give you more instructions. If you have  questions, ask your health care provider. If you were given a sedative during the procedure, it can affect you for several hours. Do not drive or operate machinery until your health care provider says that it is safe. If you will be going home right after the procedure, plan to have a responsible adult: Take you home from the hospital or clinic. You will not be allowed to drive. Care for you for the time you are told. Follow instructions from your health care provider about what you may eat and drink. Return to your normal activities as told by your health care provider. Ask your health care provider what activities are safe for you. Take over-the-counter and prescription medicines only as told by your health care provider. Contact a health care provider if you: Have a sore throat that lasts longer than one day. Have trouble swallowing. Have a fever. Get help right away if you: Vomit blood or your vomit looks like coffee grounds. Have bloody, black, or tarry stools. Have a very bad sore throat or you cannot swallow. Have difficulty breathing or very bad pain in your chest or abdomen. These symptoms may be an emergency. Get help right away. Call 911. Do not wait to see if the symptoms will go away. Do  not drive yourself to the hospital. Summary After the procedure, it is common to have a sore throat, mild stomach discomfort, bloating, and nausea. If you were given a sedative during the procedure, it can affect you for several hours. Do not drive until your health care provider says that it is safe. Follow instructions from your health care provider about what you may eat and drink. Return to your normal activities as told by your health care provider. This information is not intended to replace advice given to you by your health care provider. Make sure you discuss any questions you have with your health care provider. Document Revised: 09/01/2021 Document Reviewed: 09/01/2021 Elsevier  Patient Education  2024 [employer]. Monitored Anesthesia Care, Care After The following information offers guidance on how to care for yourself after your procedure. Your health care provider may also give you more specific instructions. If you have problems or questions, contact your health care provider. What can I expect after the procedure? After the procedure, it is common to have: Tiredness. Little or no memory about what happened during or after the procedure. Impaired judgment when it comes to making decisions. Nausea or vomiting. Some trouble with balance. Follow these instructions at home: For the time period you were told by your health care provider:  Rest. Do not participate in activities where you could fall or become injured. Do not drive or use machinery. Do not drink alcohol. Do not take sleeping pills or medicines that cause drowsiness. Do not make important decisions or sign legal documents. Do not take care of children on your own. Medicines Take over-the-counter and prescription medicines only as told by your health care provider. If you were prescribed antibiotics, take them as told by your health care provider. Do not stop using the antibiotic even if you start to feel better. Eating and drinking Follow instructions from your health care provider about what you may eat and drink. Drink enough fluid to keep your urine pale yellow. If you vomit: Drink clear fluids slowly and in small amounts as you are able. Clear fluids include water, ice chips, low-calorie sports drinks, and fruit juice that has water added to it (diluted fruit juice). Eat light and bland foods in small amounts as you are able. These foods include bananas, applesauce, rice, lean meats, toast, and crackers. General instructions  Have a responsible adult stay with you for the time you are told. It is important to have someone help care for you until you are awake and alert. If you have sleep  apnea, surgery and some medicines can increase your risk for breathing problems. Follow instructions from your health care provider about wearing your sleep device: When you are sleeping. This includes during daytime naps. While taking prescription pain medicines, sleeping medicines, or medicines that make you drowsy. Do not use any products that contain nicotine or tobacco. These products include cigarettes, chewing tobacco, and vaping devices, such as e-cigarettes. If you need help quitting, ask your health care provider. Contact a health care provider if: You feel nauseous or vomit every time you eat or drink. You feel light-headed. You are still sleepy or having trouble with balance after 24 hours. You get a rash. You have a fever. You have redness or swelling around the IV site. Get help right away if: You have trouble breathing. You have new confusion after you get home. These symptoms may be an emergency. Get help right away. Call 911. Do not wait to see if  the symptoms will go away. Do not drive yourself to the hospital. This information is not intended to replace advice given to you by your health care provider. Make sure you discuss any questions you have with your health care provider. Document Revised: 10/18/2021 Document Reviewed: 10/18/2021 Elsevier Patient Education  2024 [employer].

## 2022-11-17 ENCOUNTER — Encounter (HOSPITAL_COMMUNITY): Payer: Self-pay

## 2022-11-17 ENCOUNTER — Other Ambulatory Visit: Payer: Self-pay | Admitting: Gastroenterology

## 2022-11-17 ENCOUNTER — Encounter (HOSPITAL_COMMUNITY)
Admission: RE | Admit: 2022-11-17 | Discharge: 2022-11-17 | Disposition: A | Discharge status: Home / Self Care | Payer: Medicare HMO | Source: Ambulatory Visit | Attending: Gastroenterology | Admitting: Gastroenterology

## 2022-11-17 VITALS — BP 112/64 | HR 68 | Temp 97.8°F | Resp 16 | Ht 64.0 in | Wt 115.3 lb

## 2022-11-17 DIAGNOSIS — Z01812 Encounter for preprocedural laboratory examination: Secondary | ICD-10-CM | POA: Insufficient documentation

## 2022-11-17 DIAGNOSIS — E876 Hypokalemia: Secondary | ICD-10-CM

## 2022-11-17 DIAGNOSIS — Z79899 Other long term (current) drug therapy: Secondary | ICD-10-CM | POA: Diagnosis not present

## 2022-11-17 HISTORY — DX: Anxiety disorder, unspecified: F41.9

## 2022-11-17 LAB — BASIC METABOLIC PANEL
Anion gap: 9 (ref 5–15)
BUN: 10 mg/dL (ref 8–23)
CO2: 31 mmol/L (ref 22–32)
Calcium: 9.3 mg/dL (ref 8.9–10.3)
Chloride: 97 mmol/L — ABNORMAL LOW (ref 98–111)
Creatinine, Ser: 0.73 mg/dL (ref 0.44–1.00)
GFR, Estimated: >60 mL/min (ref >60)
Glucose, Bld: 112 mg/dL — ABNORMAL HIGH (ref 70–99)
Potassium: 2.9 mmol/L — ABNORMAL LOW (ref 3.5–5.1)
Sodium: 137 mmol/L (ref 135–145)

## 2022-11-17 MED ORDER — POTASSIUM CHLORIDE CRYS ER 20 MEQ PO TBCR
40.0000 meq | EXTENDED_RELEASE_TABLET | Freq: Every day | ORAL | 0 refills | Status: DC
Start: 2022-11-17 — End: 2023-01-16

## 2022-11-22 ENCOUNTER — Encounter (HOSPITAL_COMMUNITY): Payer: Self-pay | Admitting: Gastroenterology

## 2022-11-22 ENCOUNTER — Telehealth (INDEPENDENT_AMBULATORY_CARE_PROVIDER_SITE_OTHER): Payer: Self-pay

## 2022-11-22 ENCOUNTER — Ambulatory Visit (HOSPITAL_COMMUNITY)
Admission: RE | Admit: 2022-11-22 | Discharge: 2022-11-22 | Disposition: A | Discharge status: Home / Self Care | Payer: Medicare HMO | Attending: Gastroenterology | Admitting: Gastroenterology

## 2022-11-22 ENCOUNTER — Ambulatory Visit (HOSPITAL_COMMUNITY): Payer: Medicare HMO | Admitting: Certified Registered Nurse Anesthetist

## 2022-11-22 ENCOUNTER — Ambulatory Visit (HOSPITAL_BASED_OUTPATIENT_CLINIC_OR_DEPARTMENT_OTHER): Payer: Medicare HMO | Admitting: Certified Registered Nurse Anesthetist

## 2022-11-22 ENCOUNTER — Encounter (HOSPITAL_COMMUNITY)
Admission: RE | Disposition: A | Discharge status: Home / Self Care | Payer: Self-pay | Source: Home / Self Care | Attending: Gastroenterology

## 2022-11-22 DIAGNOSIS — K2950 Unspecified chronic gastritis without bleeding: Secondary | ICD-10-CM | POA: Diagnosis not present

## 2022-11-22 DIAGNOSIS — I10 Essential (primary) hypertension: Secondary | ICD-10-CM

## 2022-11-22 DIAGNOSIS — R634 Abnormal weight loss: Secondary | ICD-10-CM | POA: Diagnosis present

## 2022-11-22 DIAGNOSIS — R1013 Epigastric pain: Secondary | ICD-10-CM

## 2022-11-22 DIAGNOSIS — F419 Anxiety disorder, unspecified: Secondary | ICD-10-CM

## 2022-11-22 DIAGNOSIS — K2970 Gastritis, unspecified, without bleeding: Secondary | ICD-10-CM | POA: Diagnosis not present

## 2022-11-22 DIAGNOSIS — K5790 Diverticulosis of intestine, part unspecified, without perforation or abscess without bleeding: Secondary | ICD-10-CM | POA: Insufficient documentation

## 2022-11-22 DIAGNOSIS — K219 Gastro-esophageal reflux disease without esophagitis: Secondary | ICD-10-CM | POA: Diagnosis not present

## 2022-11-22 DIAGNOSIS — Z79899 Other long term (current) drug therapy: Secondary | ICD-10-CM | POA: Insufficient documentation

## 2022-11-22 DIAGNOSIS — K449 Diaphragmatic hernia without obstruction or gangrene: Secondary | ICD-10-CM

## 2022-11-22 DIAGNOSIS — K279 Peptic ulcer, site unspecified, unspecified as acute or chronic, without hemorrhage or perforation: Secondary | ICD-10-CM | POA: Diagnosis not present

## 2022-11-22 DIAGNOSIS — Z980 Intestinal bypass and anastomosis status: Secondary | ICD-10-CM

## 2022-11-22 HISTORY — PX: ESOPHAGOGASTRODUODENOSCOPY (EGD) WITH PROPOFOL: SHX5813

## 2022-11-22 HISTORY — PX: BIOPSY: SHX5522

## 2022-11-22 LAB — BASIC METABOLIC PANEL
Anion gap: 9 (ref 5–15)
BUN: 7 mg/dL — ABNORMAL LOW (ref 8–23)
CO2: 27 mmol/L (ref 22–32)
Calcium: 9.4 mg/dL (ref 8.9–10.3)
Chloride: 102 mmol/L (ref 98–111)
Creatinine, Ser: 0.81 mg/dL (ref 0.44–1.00)
GFR, Estimated: >60 mL/min (ref >60)
Glucose, Bld: 109 mg/dL — ABNORMAL HIGH (ref 70–99)
Potassium: 3.6 mmol/L (ref 3.5–5.1)
Sodium: 138 mmol/L (ref 135–145)

## 2022-11-22 SURGERY — ESOPHAGOGASTRODUODENOSCOPY (EGD) WITH PROPOFOL
Anesthesia: General

## 2022-11-22 MED ORDER — STERILE WATER FOR IRRIGATION IR SOLN
Status: DC | PRN
  Administered 2022-11-22: 60 mL

## 2022-11-22 MED ORDER — LACTATED RINGERS IV SOLN: INTRAVENOUS | Status: DC

## 2022-11-22 MED ORDER — OMEPRAZOLE 40 MG PO CPDR: 40.0000 mg | DELAYED_RELEASE_CAPSULE | Freq: Two times a day (BID) | ORAL | 1 refills | Status: DC

## 2022-11-22 MED ORDER — PROPOFOL 10 MG/ML IV BOLUS
INTRAVENOUS | Status: DC | PRN
  Administered 2022-11-22 (×2): 50 mg via INTRAVENOUS
  Administered 2022-11-22: 100 mg via INTRAVENOUS

## 2022-11-22 NOTE — H&P (Signed)
Candace [name] is an 77 y.o. female.   Chief Complaint: Weight loss HPI: 77 year old female with past medical history of anxiety, GERD, hypertension, diverticulosis, who comes for evaluation of weight loss.  Patient reports she had a bout of diarrhea due to Salmonella and this led to further weight loss 8 pounds.  States that she has some appetite but is not with PCP.  No nausea, vomiting, fever, chills, abdominal distention, melena or hematochezia.  Past Medical History:  Diagnosis Date   Anxiety    Diverticulosis    Erosive esophagitis    GERD (gastroesophageal reflux disease)    Hypertension    Hypertension     Past Surgical History:  Procedure Laterality Date   ABDOMINAL HYSTERECTOMY     ABDOMINAL SURGERY     APPENDECTOMY     COLONOSCOPY N/A 06/16/2016   rehman:diverticulosis throughout colon, internal hemorrhoids, no polyps   ESOPHAGOGASTRODUODENOSCOPY  11/10/2011   Rehman:patchy whitish exudate coating mucosa of distal half of the esophagus (confirmed candida). Spontaneous reflux of bile noted in distal esophagus. Small sliding HH. Small gastric pouch with patent B1 anastomosis, 7mm gastric polyp (hyperplastic gastric polyp).   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 No pertinent family history. Social History:  reports that she has never smoked. She has never been exposed to tobacco smoke. She has quit using smokeless tobacco. She reports that she does not drink alcohol and does not use drugs.  Allergies:  Allergies  Allergen Reactions   Diclofenac Sodium Rash   Lisinopril Anaphylaxis   Aspirin Itching and Swelling   Penicillins Hives, Itching and Swelling    Has patient had a PCN reaction causing immediate rash, facial/tongue/throat swelling, SOB or lightheadedness with hypotension: Yes Has patient had a PCN reaction causing severe rash involving mucus membranes or skin necrosis: Yes Has patient had a PCN  reaction that required hospitalization No Has patient had a PCN reaction occurring within the last 10 years: No If all of the above answers are "NO", then may proceed with Cephalosporin use.     Medications Prior to Admission  Medication Sig Dispense Refill   cloNIDine (CATAPRES) 0.1 MG tablet Take 0.1 mg by mouth at bedtime as needed (anxiety).     docusate sodium (COLACE) 100 MG capsule Take 100 mg by mouth 2 (two) times daily as needed for mild constipation.     famotidine (PEPCID) 20 MG tablet TAKE ONE TABLET BY MOUTH ONCE DAILY 90 tablet 0   fluticasone (FLONASE) 50 MCG/ACT nasal spray Place 1 spray into both nostrils daily. (Patient taking differently: Place 1 spray into both nostrils daily as needed for allergies.) 15.8 mL 0   hydrochlorothiazide (HYDRODIURIL) 25 MG tablet Take 25 mg by mouth daily.     meclizine (ANTIVERT) 25 MG tablet Take 1 tablet (25 mg total) by mouth 3 (three) times daily as needed for dizziness. 30 tablet 0   mirtazapine (REMERON) 15 MG tablet Take 15 mg by mouth at bedtime.     Multiple Vitamin (MULTIVITAMIN WITH MINERALS) TABS tablet Take 1 tablet by mouth daily.     OLANZapine (ZYPREXA) 5 MG tablet Take 5 mg by mouth at bedtime.     potassium chloride SA (KLOR-CON M) 20 MEQ tablet Take 2 tablets (40 mEq total) by mouth daily for 7 days. 14 tablet 0   rosuvastatin (CRESTOR) 10 MG tablet Take 10 mg by mouth daily.  acetaminophen (TYLENOL) 500 MG tablet Take 500 mg by mouth every 6 (six) hours as needed for moderate pain.     ciprofloxacin (CIPRO) 500 MG tablet Take 1 tablet (500 mg total) by mouth 2 (two) times daily. (Patient not taking: Reported on 11/15/2022) 20 tablet 0   metroNIDAZOLE (FLAGYL) 500 MG tablet Take 1 tablet (500 mg total) by mouth 3 (three) times daily. (Patient not taking: Reported on 11/15/2022) 30 tablet 0    No results found for this or any previous visit (from the past 48 hour(s)). No results found.  Review of Systems   Constitutional:  Positive for unexpected weight change.  All other systems reviewed and are negative.   Blood pressure (!) 152/80, pulse 80, temperature 98.5 F (36.9 C), resp. rate 12, SpO2 100 %. Physical Exam  GENERAL: The patient is AO x3, in no acute distress. HEENT: Head is normocephalic and atraumatic. EOMI are intact. Mouth is well hydrated and without lesions. NECK: Supple. No masses LUNGS: Clear to auscultation. No presence of rhonchi/wheezing/rales. Adequate chest expansion HEART: RRR, normal s1 and s2. ABDOMEN: Soft, nontender, no guarding, no peritoneal signs, and nondistended. BS +. No masses. EXTREMITIES: Without any cyanosis, clubbing, rash, lesions or edema. NEUROLOGIC: AOx3, no focal motor deficit. SKIN: no jaundice, no rashes  Assessment/Plan 77 year old female with past medical history of anxiety, GERD, hypertension, diverticulosis, who comes for evaluation of weight loss.  Will proceed with EGD.  Dolores Frame, MD 11/22/2022, 9:24 AM

## 2022-11-22 NOTE — Op Note (Signed)
[facility] Patient Name: Candace [name] Procedure Date: 11/22/2022 10:13 AM MRN: [id number] Date of Birth: [birth year] Attending MD: Katrinka Blazing , , [id number] CSN: [id number] Age: 77 Admit Type: Outpatient Procedure:                Upper GI endoscopy Indications:              Weight loss Providers:                Katrinka Blazing, Crystal Page, Lennice Sites                            Technician, Technician Referring MD:              Medicines:                Monitored Anesthesia Care Complications:            No immediate complications. Estimated Blood Loss:     Estimated blood loss: none. Procedure:                Pre-Anesthesia Assessment:                           - Prior to the procedure, a History and Physical                            was performed, and patient medications, allergies                            and sensitivities were reviewed. The patient's                            tolerance of previous anesthesia was reviewed.                           - The risks and benefits of the procedure and the                            sedation options and risks were discussed with the                            patient. All questions were answered and informed                            consent was obtained.                           - ASA Grade Assessment: III - A patient with severe                            systemic disease.                           After obtaining informed consent, the endoscope was                            passed under direct vision. Throughout the  procedure, the patient's blood pressure, pulse, and                            oxygen saturations were monitored continuously. The                            [id number]-H190 ([id number]) scope was introduced through the                            mouth, and advanced to the second part of duodenum.                            The upper GI endoscopy was accomplished without                             difficulty. The patient tolerated the procedure                            well. Scope In: 10:25:32 AM Scope Out: 10:32:12 AM Total Procedure Duration: 0 hours 6 minutes 40 seconds  Findings:      A 2 cm hiatal hernia was present.      Diffuse moderate inflammation characterized by congestion (edema),       erythema and friability was found in the entire examined stomach.       Biopsies were taken with a cold forceps for Helicobacter pylori testing.      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 A gastric       pouch was found. The gastroduodenal anastomosis was characterized by       healthy appearing mucosa. This was traversed.      The examined duodenum was normal. Impression:               - 2 cm hiatal hernia.                           - Gastritis. Biopsied.                           - Patent Billroth I gastroduodenostomy was found,                            characterized by healthy appearing mucosa.                           - Normal examined duodenum. Moderate Sedation:      Per Anesthesia Care Recommendation:           - Discharge patient to home (ambulatory).                           - Resume previous diet.                           - Await pathology results.                           - Use Prilosec (omeprazole) 40 mg PO BID.                           -  Repeat upper endoscopy for surveillance based on                            pathology results.                           - Stop famotidine. Procedure Code(s):        --- Professional ---                           [phone], Esophagogastroduodenoscopy, flexible,                            transoral; with biopsy, single or multiple Diagnosis Code(s):        --- Professional ---                           K44.9, Diaphragmatic hernia without obstruction or                            gangrene                           K29.70, Gastritis, unspecified, without bleeding                           Z98.0,  Intestinal bypass and anastomosis status                           R63.4, Abnormal weight loss CPT copyright 2022 [employer]. All rights reserved. The codes documented in this report are preliminary and upon coder review may  be revised to meet current compliance requirements. Katrinka Blazing, MD Katrinka Blazing,  11/22/2022 10:41:18 AM This report has been signed electronically. Number of Addenda: 0

## 2022-11-22 NOTE — Transfer of Care (Signed)
Immediate Anesthesia Transfer of Care Note  Patient: Candace [name]  Procedure(s) Performed: ESOPHAGOGASTRODUODENOSCOPY (EGD) WITH PROPOFOL BIOPSY  Patient Location: Short Stay  Anesthesia Type:General  Level of Consciousness: drowsy  Airway & Oxygen Therapy: Patient Spontanous Breathing  Post-op Assessment: Report given to RN and Post -op Vital signs reviewed and stable  Post vital signs: Reviewed and stable  Last Vitals:  Vitals Value Taken Time  BP 98/51 1039  Temp 98.5 1039  Pulse 76 1039  Resp 19 1039  SpO2 100 1039    Last Pain:  Vitals:   11/22/22 1022  PainSc: 0-No pain         Complications: No notable events documented.

## 2022-11-22 NOTE — Anesthesia Postprocedure Evaluation (Signed)
Anesthesia Post Note  Patient: Candace [name]  Procedure(s) Performed: ESOPHAGOGASTRODUODENOSCOPY (EGD) WITH PROPOFOL BIOPSY  Patient location during evaluation: Phase II Anesthesia Type: General Level of consciousness: awake and alert and oriented Pain management: pain level controlled Vital Signs Assessment: post-procedure vital signs reviewed and stable Respiratory status: spontaneous breathing, nonlabored ventilation and respiratory function stable Cardiovascular status: blood pressure returned to baseline and stable Postop Assessment: no apparent nausea or vomiting Anesthetic complications: no  No notable events documented.   Last Vitals:  Vitals:   11/22/22 0916 11/22/22 1038  BP: (!) 152/80 (!) 98/51  Pulse: 80 74  Resp: 12 19  Temp: 36.9 C 36.9 C  SpO2: 100% 100%    Last Pain:  Vitals:   11/22/22 1038  TempSrc: Oral  PainSc: 0-No pain                 Tylisa Alcivar C Klee Kolek

## 2022-11-22 NOTE — Anesthesia Procedure Notes (Signed)
Date/Time: 11/22/2022 10:22 AM  Performed by: Franco Nones, CRNAPre-anesthesia Checklist: Patient identified, Emergency Drugs available, Suction available, Timeout performed and Patient being monitored Patient Re-evaluated:Patient Re-evaluated prior to induction Oxygen Delivery Method: Nasal Cannula

## 2022-11-22 NOTE — Anesthesia Preprocedure Evaluation (Signed)
Anesthesia Evaluation  Patient identified by MRN, date of birth, ID band Patient awake    Reviewed: Allergy & Precautions, H&P , NPO status , Patient's Chart, lab work & pertinent test results  Airway Mallampati: I  TM Distance: >3 FB Neck ROM: Full    Dental  (+) Edentulous Upper, Edentulous Lower   Pulmonary neg pulmonary ROS   Pulmonary exam normal breath sounds clear to auscultation       Cardiovascular Exercise Tolerance: Good hypertension, Pt. on medications Normal cardiovascular exam Rhythm:Regular Rate:Normal     Neuro/Psych  PSYCHIATRIC DISORDERS Anxiety     negative neurological ROS     GI/Hepatic Neg liver ROS, PUD,GERD  Medicated and Controlled,,  Endo/Other  negative endocrine ROS    Renal/GU negative Renal ROS  negative genitourinary   Musculoskeletal negative musculoskeletal ROS (+)    Abdominal   Peds negative pediatric ROS (+)  Hematology negative hematology ROS (+)   Anesthesia Other Findings   Reproductive/Obstetrics negative OB ROS                             Anesthesia Physical Anesthesia Plan  ASA: 2  Anesthesia Plan: General   Post-op Pain Management: Minimal or no pain anticipated   Induction: Intravenous  PONV Risk Score and Plan: 0 and Propofol infusion  Airway Management Planned: Nasal Cannula and Natural Airway  Additional Equipment:   Intra-op Plan:   Post-operative Plan:   Informed Consent: I have reviewed the patients History and Physical, chart, labs and discussed the procedure including the risks, benefits and alternatives for the proposed anesthesia with the patient or authorized representative who has indicated his/her understanding and acceptance.       Plan Discussed with: CRNA and Surgeon  Anesthesia Plan Comments:         Anesthesia Quick Evaluation

## 2022-11-22 NOTE — Telephone Encounter (Signed)
It should help, especially as she is having significant gastric inflammation

## 2022-11-22 NOTE — Telephone Encounter (Signed)
Patient made aware this should help.

## 2022-11-22 NOTE — Telephone Encounter (Signed)
Patient had a egd today and saw that she was supposed to take omeprazole 40 mg bid. She wanted to know if this was to help with the fullness that she has all the time after eating. I advised that the omeprazole was for acid reflux and she was supposed to stop famotidine. I advised if she continues to have the fullness to call back she may need a follow up appointment . Patient states understanding.

## 2022-11-25 LAB — SURGICAL PATHOLOGY

## 2022-11-28 ENCOUNTER — Encounter (HOSPITAL_COMMUNITY): Payer: Self-pay | Admitting: Gastroenterology

## 2022-11-29 ENCOUNTER — Telehealth (INDEPENDENT_AMBULATORY_CARE_PROVIDER_SITE_OTHER): Payer: Self-pay

## 2022-11-29 DIAGNOSIS — K2210 Ulcer of esophagus without bleeding: Secondary | ICD-10-CM

## 2022-11-29 DIAGNOSIS — K219 Gastro-esophageal reflux disease without esophagitis: Secondary | ICD-10-CM

## 2022-11-29 DIAGNOSIS — R6881 Early satiety: Secondary | ICD-10-CM

## 2022-11-29 DIAGNOSIS — R634 Abnormal weight loss: Secondary | ICD-10-CM

## 2022-11-29 DIAGNOSIS — R1013 Epigastric pain: Secondary | ICD-10-CM

## 2022-11-29 DIAGNOSIS — K2970 Gastritis, unspecified, without bleeding: Secondary | ICD-10-CM

## 2022-12-02 ENCOUNTER — Other Ambulatory Visit (INDEPENDENT_AMBULATORY_CARE_PROVIDER_SITE_OTHER): Payer: Self-pay

## 2022-12-02 ENCOUNTER — Encounter (INDEPENDENT_AMBULATORY_CARE_PROVIDER_SITE_OTHER): Payer: Self-pay

## 2022-12-02 DIAGNOSIS — K2210 Ulcer of esophagus without bleeding: Secondary | ICD-10-CM

## 2022-12-02 DIAGNOSIS — K219 Gastro-esophageal reflux disease without esophagitis: Secondary | ICD-10-CM

## 2022-12-02 DIAGNOSIS — K2970 Gastritis, unspecified, without bleeding: Secondary | ICD-10-CM

## 2022-12-02 DIAGNOSIS — R1013 Epigastric pain: Secondary | ICD-10-CM

## 2022-12-02 DIAGNOSIS — R6881 Early satiety: Secondary | ICD-10-CM

## 2022-12-02 DIAGNOSIS — K5900 Constipation, unspecified: Secondary | ICD-10-CM

## 2022-12-02 DIAGNOSIS — R634 Abnormal weight loss: Secondary | ICD-10-CM

## 2022-12-13 NOTE — Progress Notes (Unsigned)
Name: Candace [name] DOB: [birth year] MRN: [id number]  History of Present Illness: Candace [name] is a 77 y.o. female who presents today as a new patient at [facility]. All available relevant medical records have been reviewed.  - GU / GYN History: 1. Vaginal atrophy. 2. Lichen sclerosus. 3. Surgical history includes hysterectomy.  She reports chief complaint of recurrent UTls.   Urine culture results in past 12 months: - 03/18/2022: Negative - 06/23/2022: Positive for >100k E. coli - 09/22/2022: Positive for >100k E. coli  Urinary Symptoms: She reports 2+ UTl's in the last year. When present, UTI symptoms include increased urinary urgency and frequency. She denies acute UTI symptoms today.  At baseline: She denies urinary urgency, frequency, incontinence, dysuria, gross hematuria, hesitancy, straining to void, or sensations of incomplete emptying. She reports voiding 4 times per day and 0 times at night.  She denies history of pyelonephritis.  She reports distant history of kidney stones (1970).  Vaginal / prolapse Symptoms: She denies vaginal bulge sensation.  She denies seeing a vaginal bulge.  She denies vaginal pain, bleeding, or discharge.  She reports previous use of topical vaginal estrogen cream and found that to be helpful.  Bowel Symptoms: She reports intermittent constipation; takes Miralax PRN.  She denies diarrhea, rectal bleeding, pain with defecation, straining to defecate, or fecal incontinence.  Past OB/GYN History: OB History     Gravida  1   Para  1   Term  1   Preterm      AB      Living  1      SAB      IAB      Ectopic      Multiple      Live Births             She denies being sexually active.  She has 1 child(ren) who were delivered vaginally. She denies significant tearing with that delivery. She is post menopausal.  She reports history of hysterectomy.  Fall Screening: Do you usually have a device to  assist in your mobility? No   Medications: Current Outpatient Medications  Medication Sig Dispense Refill   acetaminophen (TYLENOL) 500 MG tablet Take 500 mg by mouth every 6 (six) hours as needed for moderate pain.     cloNIDine (CATAPRES) 0.1 MG tablet Take 0.1 mg by mouth at bedtime as needed (anxiety).     docusate sodium (COLACE) 100 MG capsule Take 100 mg by mouth 2 (two) times daily as needed for mild constipation.     estradiol (ESTRACE) 0.1 MG/GM vaginal cream Discard plastic applicator. Insert a blueberry size amount (approximately 1 gram) of cream on fingertip inside vagina at bedtime every night for 1 week then 2 nights per week for long term use. 42 each 3   fluticasone (FLONASE) 50 MCG/ACT nasal spray Place 1 spray into both nostrils daily. (Patient taking differently: Place 1 spray into both nostrils daily as needed for allergies.) 15.8 mL 0   hydrochlorothiazide (HYDRODIURIL) 25 MG tablet Take 25 mg by mouth daily.     meclizine (ANTIVERT) 25 MG tablet Take 1 tablet (25 mg total) by mouth 3 (three) times daily as needed for dizziness. 30 tablet 0   metroNIDAZOLE (FLAGYL) 500 MG tablet Take 1 tablet (500 mg total) by mouth 3 (three) times daily. 30 tablet 0   mirtazapine (REMERON) 15 MG tablet Take 15 mg by mouth at bedtime.     Multiple Vitamin (MULTIVITAMIN  WITH MINERALS) TABS tablet Take 1 tablet by mouth daily.     omeprazole (PRILOSEC) 40 MG capsule Take 1 capsule (40 mg total) by mouth 2 (two) times daily. 180 capsule 1   rosuvastatin (CRESTOR) 10 MG tablet Take 10 mg by mouth daily.     ciprofloxacin (CIPRO) 500 MG tablet Take 1 tablet (500 mg total) by mouth 2 (two) times daily. (Patient not taking: Reported on 12/15/2022) 20 tablet 0   OLANZapine (ZYPREXA) 5 MG tablet Take 5 mg by mouth at bedtime. (Patient not taking: Reported on 12/15/2022)     potassium chloride SA (KLOR-CON M) 20 MEQ tablet Take 2 tablets (40 mEq total) by mouth daily for 7 days. 14 tablet 0   No  current facility-administered medications for this visit.    Allergies: Allergies  Allergen Reactions   Diclofenac Sodium Rash   Lisinopril Anaphylaxis   Aspirin Itching and Swelling   Penicillins Hives, Itching and Swelling    Has patient had a PCN reaction causing immediate rash, facial/tongue/throat swelling, SOB or lightheadedness with hypotension: Yes Has patient had a PCN reaction causing severe rash involving mucus membranes or skin necrosis: Yes Has patient had a PCN reaction that required hospitalization No Has patient had a PCN reaction occurring within the last 10 years: No If all of the above answers are "NO", then may proceed with Cephalosporin use.     Past Medical History:  Diagnosis Date   Anxiety    Diverticulosis    Erosive esophagitis    GERD (gastroesophageal reflux disease)    Hypertension    Hypertension    Past Surgical History:  Procedure Laterality Date   ABDOMINAL HYSTERECTOMY     ABDOMINAL SURGERY     APPENDECTOMY     BIOPSY  11/22/2022   Procedure: BIOPSY;  Surgeon: Dolores Frame, MD;  [facility]: [facility];  Service: Gastroenterology;;   COLONOSCOPY N/A 06/16/2016   rehman:diverticulosis throughout colon, internal hemorrhoids, no polyps   ESOPHAGOGASTRODUODENOSCOPY  11/10/2011   Rehman:patchy whitish exudate coating mucosa of distal half of the esophagus (confirmed candida). Spontaneous reflux of bile noted in distal esophagus. Small sliding HH. Small gastric pouch with patent B1 anastomosis, 7mm gastric polyp (hyperplastic gastric polyp).   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 No pertinent  family history. Social History   Socioeconomic History   Marital status: Single    Spouse name: Not on file   Number of children: Not on file   Years of education: Not on file   Highest education level: Not on file  Occupational History   Not on file  Tobacco Use   Smoking status: Never    Passive exposure: Never   Smokeless tobacco: Former  Building services engineer status: Never Used  Substance and Sexual Activity   Alcohol use: No   Drug use: No   Sexual activity: Not Currently  Other Topics Concern   Not on file  Social History Narrative   Not on file   Social Determinants of Health   Financial Resource Strain: Not on file  Food Insecurity: Not on file  Transportation Needs: Not on file  Physical Activity: Not  on file  Stress: Not on file  Social Connections: Unknown (10/07/2021)   Received from [facility]   Social Network    Social Network: Not on file  Intimate Partner Violence: Unknown (09/08/2021)   Received from [facility]   HITS    Physically Hurt: Not on file    Insult or Talk Down To: Not on file    Threaten Physical Harm: Not on file    Scream or Curse: Not on file    SUBJECTIVE  Review of Systems Constitutional: Patient denies any unintentional weight loss or change in strength lntegumentary: Patient denies any rashes or pruritus Cardiovascular: Patient denies chest pain or syncope Respiratory: Patient denies shortness of breath Gastrointestinal: Patient denies nausea, vomiting, constipation, or diarrhea Musculoskeletal: Patient denies muscle cramps or weakness Neurologic: Patient denies convulsions or seizures Psychiatric: Patient denies memory problems Allergic/Immunologic: Patient denies recent allergic reaction(s) Hematologic/Lymphatic: Patient denies bleeding tendencies Endocrine: Patient denies heat/cold intolerance  GU: As per HPI.  OBJECTIVE Vitals:   12/15/22 0922  BP: 136/73  Pulse: 88  Temp: 97.9 F (36.6 C)   There is no  height or weight on file to calculate BMI.  Physical Examination Constitutional: No obvious distress; patient is non-toxic appearing  Cardiovascular: No visible lower extremity edema.  Respiratory: The patient does not have audible wheezing/stridor; respirations do not appear labored  Gastrointestinal: Abdomen non-distended Musculoskeletal: Normal ROM of UEs  Skin: No obvious rashes/open sores  Neurologic: CN 2-12 grossly intact Psychiatric: Answered questions appropriately with normal affect  Hematologic/Lymphatic/Immunologic: No obvious bruises or sites of spontaneous bleeding  UA: positive for 6-10 WBC/hpf PVR: 10 ml  ASSESSMENT Recurrent UTI - Plan: Urinalysis, Routine w reflex microscopic, BLADDER SCAN AMB NON-IMAGING, estradiol (ESTRACE) 0.1 MG/GM vaginal cream  Atrophic vaginitis - Plan: Urinalysis, Routine w reflex microscopic, BLADDER SCAN AMB NON-IMAGING, estradiol (ESTRACE) 0.1 MG/GM vaginal cream  Lichen sclerosus - Plan: Urinalysis, Routine w reflex microscopic, BLADDER SCAN AMB NON-IMAGING, estradiol (ESTRACE) 0.1 MG/GM vaginal cream  Recurrent UTls:  We discussed the possible etiologies of recurrent UTls including ascending infection related to intercourse; vaginal atrophy; transmural infection that has been treated incompletely; urinary tract stones; incomplete bladder emptying with urinary stasis; kidney or bladder tumor; urethral diverticulum; and colonization of  vagina and urinary tract with pathologic, adherent organisms.   For UTI prevention we discussed options including: Adequate fluid intake (>1.5 liters/day) to flush out the urinary tract. - Go to the bathroom to urinate every 4-6 hours while awake to minimize urinary stasis / bacterial overgrowth in the bladder. - Proanthocyanidin (PAC) supplement 36 mg daily; must be soluble (insoluble form of PAC will be ineffective). Recommend Ellura. D-mannose 2 g daily Vitamin C supplement Probiotic to maintain healthy  vaginal microbiome - Topical vaginal estrogen for vaginal atrophy.  If recurrent UTls persist, may consider UTI prophylaxis with a daily low dose antibiotic in the future.  For management of acute UTI symptoms: - Patient was advised that if/when UTI-like symptoms occur they can call our office to speak with a nurse, who can place an order for urinalysis (with reflex to urine culture). They may then proceed to a [facility] to provide their urine sample. This is required for evaluation in order for their Urology provider to make informed treatment recommendation(s), which may or may not include an antibiotic prescription. - Discussed option to take over-the-counter Pyridium (commonly known under the AZO brand name) for bladder pain. No more than 3 days at a time.  Patient ultimately  decided to start with topical vaginal estrogen cream and will consider OTC supplements for UTI prevention. Handout provided.  2. Vaginal atrophy:  The etiology and consequences of urogenital epithelial atrophy was explained to patient. The thinning of the epithelium of the urethra can contribute to urinary urgency and frequency syndromes. In addition, the normal bacterial flora that colonizes the perineum may contribute to UTI risk because the thin urethral epithelium allows the bacteria to become adherent and the change in vaginal pH can disrupt the vaginal / urethral microbiome and allow for bacterial overgrowth. Patient was advised that topical vaginal estrogen replacement will take about 3 months to restore the vaginal pH. We discussed that there have been studies that evaluate use of low-dose intravaginal estrogen cream that shows minimal systemic absorption that is negligible after 3 weeks. There have been no studies indicating increased risk of contributing to breast cancer development or recurrence. Patient elected to restart topical vaginal estrogen cream.  Will plan for follow up in 6 months or sooner if  needed. Pt verbalized understanding and agreement. All questions were answered.  PLAN Advised the following: 1. Start topical vaginal estrogen cream at least 2 nights per week. 2. Adequate fluid intake (>1.5 liters/day) to flush out the urinary tract. 3. Urinate every 4-6 hours while awake to minimize urinary stasis/ bacterial overgrowth in the bladder. 4. Consider OTC supplements for UTI prevention. 5. Return in about 6 months (around 06/17/2023) for UA, PVR, & f/u with Evette Georges NP.  Orders Placed This Encounter  Procedures   Urinalysis, Routine w reflex microscopic   BLADDER SCAN AMB NON-IMAGING    It has been explained that the patient is to follow regularly with their PCP in addition to all other providers involved in their care and to follow instructions provided by these respective offices. Patient advised to contact urology clinic if any urologic-pertaining questions, concerns, new symptoms or problems arise in the interim period.  Patient Instructions  Recommendations regarding UTI prevention / management:  When UTI symptoms occur: Call urology office to request order for urine culture. We recommend waiting for urine culture result prior to use of any antibiotics.  For bladder pain/ burning with urination: Over the counter Pyridium (phenazopyridine) as needed (commonly known under the "AZO" brand). No more than 3 days consecutively at a time due to risk for methemoglobinemia, liver function issues, and bone health damage with long term use of Pyridium.  Routine use for UTI prevention: - Topical vaginal estrogen for vaginal atrophy. Adequate fluid intake (>1.5 liters/day) to flush out the urinary tract. - Go to the bathroom to urinate every 4-6 hours while awake to minimize urinary stasis / bacterial overgrowth in the bladder. - Proanthocyanidin (PAC) supplement 36 mg daily; must be soluble (insoluble form of PAC will be ineffective). Recommended brand: Ellura. This is an  over-the-counter supplement (often must be found/ purchased online) supplement derived from cranberries with concentrated active component: Proanthocyanidin (PAC) 36 mg daily. Decreases bacterial adherence to bladder lining. Not recommended for patients with interstitial cystitis due to acidity. - D-mannose powder (2 grams daily). This is an over-the-counter supplement which decreases bacterial adherence to bladder lining (it is a sugar that inhibits bacterial adherence to urothelial cells by binding to the pili of enteric bacteria). Take as per manufacturer recommendation. Can be used as an alternative or in addition to the concentrated cranberry supplement. Not recommended for diabetic patients due to sugar content. - Vitamin C supplement to acidify urine to minimize bacterial growth. Not recommended  for patients with interstitial cystitis due to acidity. - Probiotic to maintain healthy vaginal microbiome to suppress bacteria at urethral opening. Brand recommendations: Darrold Junker (includes probiotic & D-mannose ), Feminine Balance (highest concentration of lactobacillus) or Hyperbiotic Pro 15.  Note for patients with diabetes: You may read about D-mannose powder for UTI prevention. That is an over the-counter supplement which decreases bacterial adherence to bladder lining. I would NOT advise that for you as a person with diabetes due to its sugar content.   Electronically signed by:  Donnita Falls, MSN, FNP-C, CUNP 12/15/2022 9:55 AM

## 2022-12-15 ENCOUNTER — Encounter: Payer: Self-pay | Admitting: Urology

## 2022-12-15 ENCOUNTER — Ambulatory Visit: Payer: Medicare HMO | Admitting: Urology

## 2022-12-15 VITALS — BP 136/73 | HR 88 | Temp 97.9°F

## 2022-12-15 DIAGNOSIS — N390 Urinary tract infection, site not specified: Secondary | ICD-10-CM

## 2022-12-15 DIAGNOSIS — Z87440 Personal history of urinary (tract) infections: Secondary | ICD-10-CM | POA: Diagnosis not present

## 2022-12-15 DIAGNOSIS — L900 Lichen sclerosus et atrophicus: Secondary | ICD-10-CM

## 2022-12-15 DIAGNOSIS — N952 Postmenopausal atrophic vaginitis: Secondary | ICD-10-CM | POA: Diagnosis not present

## 2022-12-15 LAB — URINALYSIS, ROUTINE W REFLEX MICROSCOPIC
Bilirubin, UA: NEGATIVE
Glucose, UA: NEGATIVE
Ketones, UA: NEGATIVE
Nitrite, UA: NEGATIVE
Protein,UA: NEGATIVE
RBC, UA: NEGATIVE
Specific Gravity, UA: 1.015 (ref 1.005–1.030)
Urobilinogen, Ur: 1 mg/dL (ref 0.2–1.0)
pH, UA: 7 (ref 5.0–7.5)

## 2022-12-15 LAB — BLADDER SCAN AMB NON-IMAGING: Scan Result: 10

## 2022-12-15 LAB — MICROSCOPIC EXAMINATION
Bacteria, UA: NONE SEEN
RBC, Urine: NONE SEEN /hpf (ref 0–2)

## 2022-12-15 MED ORDER — ESTRADIOL 0.1 MG/GM VA CREA
TOPICAL_CREAM | VAGINAL | 3 refills | Status: AC
Start: 2022-12-15 — End: ?

## 2022-12-15 NOTE — Patient Instructions (Addendum)
Recommendations regarding UTI prevention / management:  When UTI symptoms occur: Call urology office to request order for urine culture. We recommend waiting for urine culture result prior to use of any antibiotics.  For bladder pain/ burning with urination: Over the counter Pyridium (phenazopyridine) as needed (commonly known under the "AZO" brand). No more than 3 days consecutively at a time due to risk for methemoglobinemia, liver function issues, and bone health damage with long term use of Pyridium.  Routine use for UTI prevention: - Topical vaginal estrogen for vaginal atrophy. Adequate fluid intake (>1.5 liters/day) to flush out the urinary tract. - Go to the bathroom to urinate every 4-6 hours while awake to minimize urinary stasis / bacterial overgrowth in the bladder. - Proanthocyanidin (PAC) supplement 36 mg daily; must be soluble (insoluble form of PAC will be ineffective). Recommended brand: Ellura. This is an over-the-counter supplement (often must be found/ purchased online) supplement derived from cranberries with concentrated active component: Proanthocyanidin (PAC) 36 mg daily. Decreases bacterial adherence to bladder lining. Not recommended for patients with interstitial cystitis due to acidity. - D-mannose powder (2 grams daily). This is an over-the-counter supplement which decreases bacterial adherence to bladder lining (it is a sugar that inhibits bacterial adherence to urothelial cells by binding to the pili of enteric bacteria). Take as per manufacturer recommendation. Can be used as an alternative or in addition to the concentrated cranberry supplement. Not recommended for diabetic patients due to sugar content. - Vitamin C supplement to acidify urine to minimize bacterial growth. Not recommended for patients with interstitial cystitis due to acidity. - Probiotic to maintain healthy vaginal microbiome to suppress bacteria at urethral opening. Brand recommendations: Darrold Junker  (includes probiotic & D-mannose ), Feminine Balance (highest concentration of lactobacillus) or Hyperbiotic Pro 15.  Note for patients with diabetes: You may read about D-mannose powder for UTI prevention. That is an over the-counter supplement which decreases bacterial adherence to bladder lining. I would NOT advise that for you as a person with diabetes due to its sugar content.

## 2023-01-16 ENCOUNTER — Ambulatory Visit (INDEPENDENT_AMBULATORY_CARE_PROVIDER_SITE_OTHER): Payer: Medicare HMO | Admitting: Gastroenterology

## 2023-01-16 ENCOUNTER — Encounter (INDEPENDENT_AMBULATORY_CARE_PROVIDER_SITE_OTHER): Payer: Self-pay | Admitting: Gastroenterology

## 2023-01-16 VITALS — BP 124/73 | HR 81 | Temp 97.5°F | Ht 64.0 in | Wt 104.3 lb

## 2023-01-16 DIAGNOSIS — R634 Abnormal weight loss: Secondary | ICD-10-CM

## 2023-01-16 DIAGNOSIS — K2970 Gastritis, unspecified, without bleeding: Secondary | ICD-10-CM

## 2023-01-16 DIAGNOSIS — K2990 Gastroduodenitis, unspecified, without bleeding: Secondary | ICD-10-CM

## 2023-01-16 NOTE — Progress Notes (Addendum)
Referring Provider: [facility]* Primary Care Physician:  Alvina Filbert, MD Primary GI Physician: Dr. Levon Hedger   Chief Complaint  Patient presents with   Follow-up    Patient here today for a follow up. She says she has gained some weight and then lost it again. She has a decreased appetite. Patient states gerd is doing well on omeprazole 40 mg bid.    HPI:   Candace [name] is a 77 y.o. female with past medical history of HTN, GERD, constipation, erosive esophagitis, gastric ulcer, leiomyoma.    Patient presenting today for follow up of gastritis, weight loss and constipation  Last seen April 2024, at that time down 15 lbs over the last 7 months. Constipation improved with dietary changes. Having a BM every 3 days. Improvement in abdominal pain with a BM. Having pressure in chest/epigastrum. Had made some dietary changes, cutting out greasy, fried foods, white bread, taking famotidine 20mg  daily.   Recommended to schedule EGD, continue with pepcid 20mg  daily  Started on prilosec 40mg  BID after EGD, H pylori breath test on 8/2 was negative   Present:  Weight down to 104 pounds from 115 pounds over the past 4 months, notably 130 pounds in August 2023.   Patient states she is feeling better and able to eat more things than she could previously since starting Omeprazole 40mg  BID. She notes she had salmonella in May and feels like she has done much better since getting over that. She is trying to do more veggies, avoids sweets now, doing more whole grain breads. She does not a decent amount of boiled chicken and fish in her diet.  She states she had gained weight for a few weeks then she was back down, she notes that she tracks her weight at home and seems to go up and down. She is having a BM almost daily, is not having to take anything for constipation now since changing her diet. She is trying to drink plenty of water.  denies melena, hematochezia, nausea, vomiting, diarrhea,  constipation, dysphagia, odyonophagia, early satiety or weight loss.    Cologuard; October 2023 negative  Last Colonoscopy:(2018)diverticulosis throughout colon, internal hemorrhoids, no polyps  Last Endoscopy:11/2022- 2 cm hiatal hernia.                           - Gastritis. Biopsied. (Mild/chronic)                           - Patent Billroth I gastroduodenostomy was found,                            characterized by healthy appearing mucosa.                           - Normal examined duodenum  Recommendations:    Past Medical History:  Diagnosis Date   Anxiety    Diverticulosis    Erosive esophagitis    GERD (gastroesophageal reflux disease)    Hypertension    Hypertension     Past Surgical History:  Procedure Laterality Date   ABDOMINAL HYSTERECTOMY     ABDOMINAL SURGERY     APPENDECTOMY     BIOPSY  11/22/2022   Procedure: BIOPSY;  Surgeon: Dolores Frame, MD;  [facility];  Service: Gastroenterology;;  COLONOSCOPY N/A 06/16/2016   rehman:diverticulosis throughout colon, internal hemorrhoids, no polyps   ESOPHAGOGASTRODUODENOSCOPY  11/10/2011   Rehman:patchy whitish exudate coating mucosa of distal half of the esophagus (confirmed candida). Spontaneous reflux of bile noted in distal esophagus. Small sliding HH. Small gastric pouch with patent B1 anastomosis, 7mm gastric polyp (hyperplastic gastric polyp).   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      cloNIDine (CATAPRES) 0.1 MG tablet Take 0.1 mg by mouth at bedtime as needed (anxiety).     docusate sodium (COLACE) 100 MG capsule Take 100 mg by mouth 2 (two) times daily as needed for mild constipation.     estradiol (ESTRACE) 0.1 MG/GM vaginal cream Discard plastic applicator. Insert a blueberry size amount (approximately 1 gram) of cream on fingertip inside vagina at bedtime every night for 1 week then 2 nights per week for long term use. 42 each 3   fluticasone (FLONASE) 50 MCG/ACT nasal spray Place 1 spray into both nostrils daily. (Patient taking differently: Place 1 spray into both nostrils daily as needed for allergies.) 15.8 mL 0   hydrochlorothiazide (HYDRODIURIL) 25 MG tablet Take 25 mg by mouth daily.     mirtazapine (REMERON) 15 MG tablet Take 15 mg by mouth at bedtime.     Multiple Vitamin (MULTIVITAMIN WITH MINERALS) TABS tablet Take 1 tablet by mouth daily.     OLANZapine (ZYPREXA) 5 MG tablet Take 5 mg by mouth at bedtime.     omeprazole (PRILOSEC) 40 MG capsule Take 1 capsule (40 mg total) by mouth 2 (two) times daily. 180 capsule 1   rosuvastatin (CRESTOR) 10 MG tablet Take 10 mg by mouth daily.     meclizine (ANTIVERT) 25 MG tablet Take 1 tablet (25 mg total) by mouth 3 (three) times daily as needed for dizziness. (Patient not taking: Reported on 01/16/2023) 30 tablet 0   No current facility-administered medications for this visit.    Allergies as of 01/16/2023 - Review Complete 01/16/2023  Allergen Reaction Noted   Diclofenac sodium Rash 07/31/2013   Lisinopril Anaphylaxis 05/18/2022   Aspirin Itching and Swelling 02/21/2011   Penicillins Hives, Itching, and Swelling 02/21/2011    History reviewed. No pertinent family history.  Social History   Socioeconomic History   Marital status: Single    Spouse name: Not on file   Number of children: Not on file   Years of education: Not on file   Highest education level: Not on file  Occupational History   Not on file   Tobacco Use   Smoking status: Never    Passive exposure: Never   Smokeless tobacco: Former  Building services engineer status: Never Used  Substance and Sexual Activity   Alcohol use: No   Drug use: No   Sexual activity: Not Currently  Other Topics Concern   Not on file  Social History Narrative   Not on file   Social Determinants of Health   Financial Resource Strain: Not on file  Food Insecurity:  Not on file  Transportation Needs: Not on file  Physical Activity: Not on file  Stress: Not on file  Social Connections: Unknown (10/07/2021)   Received from [facility]   Social Network    Social Network: Not on file   Review of systems General: negative for malaise, night sweats, fever, chills, +weight loss  Neck: Negative for lumps, goiter, pain and significant neck swelling Resp: Negative for cough, wheezing, dyspnea at rest CV: Negative for chest pain, leg swelling, palpitations, orthopnea GI: denies melena, hematochezia, nausea, vomiting, diarrhea, constipation, dysphagia, odyonophagia, early satiety +weight loss  MSK: Negative for joint pain or swelling, back pain, and muscle pain. Derm: Negative for itching or rash Psych: Denies depression, anxiety, memory loss, confusion. No homicidal or suicidal ideation.  Heme: Negative for prolonged bleeding, bruising easily, and swollen nodes. Endocrine: Negative for cold or heat intolerance, polyuria, polydipsia and goiter. Neuro: negative for tremor, gait imbalance, syncope and seizures. The remainder of the review of systems is noncontributory.  Physical Exam: BP 124/73 (BP Location: Left Arm, Patient Position: Sitting, Cuff Size: Normal)   Pulse 81   Temp (!) 97.5 F (36.4 C) (Temporal)   Ht 5\' 4"  (1.626 m)   Wt 104 lb 4.8 oz (47.3 kg)   BMI 17.90 kg/m  General:   Alert and oriented. No distress noted. Pleasant and cooperative.  Head:  Normocephalic and atraumatic. Eyes:  Conjuctiva clear without scleral  icterus. Mouth:  Oral mucosa pink and moist. Good dentition. No lesions. Heart: Normal rate and rhythm, s1 and s2 heart sounds present.  Lungs: Clear lung sounds in all lobes. Respirations equal and unlabored. Abdomen:  +BS, soft, non-tender and non-distended. No rebound or guarding. No HSM or masses noted. Derm: No palmar erythema or jaundice Msk:  Symmetrical without gross deformities. Normal posture. Extremities:  Without edema. Neurologic:  Alert and  oriented x4 Psych:  Alert and cooperative. Normal mood and affect.  Invalid input(s): "6 MONTHS"   ASSESSMENT: Candace [name] is a 77 y.o. female presenting today for follow up of weight loss, constipation and gastritis  Gastritis: as seen on recent EGD, started on omeprazole 40mg  BID, doing well on this. Noting she can eat things she could not before. No nausea, vomiting, abdominal pain, rectal bleeding or melena.  Constipation: has resolved with dietary changes, having a BM almost daily  Weight loss: down from 130 pounds since August 2023, she had maintained around 115 lbs for the past few months then dropped down to 104 lbs since June. Feels appetite is good and she is eating well, notably on remeron 15mg  at bedtime. Weight loss etiology unclear. She has made some dietary changes over the past year but would expect weight to level out. Will check TSH to rule out thyroid etiology. Need to consider CT imaging of the abdomen if TSH normal and weight loss continues    PLAN: Continue omeprazole 40mg  BID  2. Continue with healthy diet  3. Focus on high protein diet, can add protein shake daily 4. Consider CT A/P with contrast if further weight loss  5. Will check TSH 6. Continue with good water intake, diet high in fruits, veggies, whole grains   All questions were answered, patient verbalized understanding and is in agreement with plan as outlined above.   Follow Up: 2 months   Orissa Arreaga L. Jeanmarie Hubert, MSN, APRN,  AGNP-C Adult-Gerontology Nurse Practitioner [facility]  I have reviewed the note and agree with the APP's  assessment as described in this progress note  If further weight loss, will also need to proceed with colonoscopy.  Katrinka Blazing, MD Gastroenterology and Hepatology [facility] Gastroenterology

## 2023-01-16 NOTE — Patient Instructions (Signed)
Please continue with omeprazole 40mg  twice daily We will check thyroid function to rule this out as contributing to your weight loss Please continue with healthy diet, make sure you are getting plenty of protein with lean meats such as chicken, [place] and fish If weight loss continues, we may need to consider doing a CT of your abdomen to rule out any other causes of weight loss  Follow up 2 months  It was a pleasure to see you today. I want to create trusting relationships with patients and provide genuine, compassionate, and quality care. I truly value your feedback! please be on the lookout for a survey regarding your visit with me today. I appreciate your input about our visit and your time in completing this!    Dejanee Thibeaux L. Jeanmarie Hubert, MSN, APRN, AGNP-C Adult-Gerontology Nurse Practitioner [facility] Gastroenterology at [facility]

## 2023-03-30 ENCOUNTER — Ambulatory Visit (INDEPENDENT_AMBULATORY_CARE_PROVIDER_SITE_OTHER): Payer: Medicare HMO | Admitting: Gastroenterology

## 2023-03-30 ENCOUNTER — Encounter (INDEPENDENT_AMBULATORY_CARE_PROVIDER_SITE_OTHER): Payer: Self-pay | Admitting: Gastroenterology

## 2023-03-30 VITALS — BP 115/67 | HR 80 | Temp 98.1°F | Ht 64.0 in | Wt 101.6 lb

## 2023-03-30 DIAGNOSIS — K219 Gastro-esophageal reflux disease without esophagitis: Secondary | ICD-10-CM | POA: Diagnosis not present

## 2023-03-30 DIAGNOSIS — R1030 Lower abdominal pain, unspecified: Secondary | ICD-10-CM | POA: Insufficient documentation

## 2023-03-30 DIAGNOSIS — R634 Abnormal weight loss: Secondary | ICD-10-CM

## 2023-03-30 NOTE — Patient Instructions (Signed)
Please continue with omeprazole 40mg  twice daily Continue with 3 meals per day and can add 1-2 protein shakes to help keep weight up We will get you scheduled for colonoscopy for further evaluation of your weight loss  Follow up 4 months  It was a pleasure to see you today. I want to create trusting relationships with patients and provide genuine, compassionate, and quality care. I truly value your feedback! please be on the lookout for a survey regarding your visit with me today. I appreciate your input about our visit and your time in completing this!    Hrishikesh Hoeg L. Jeanmarie Hubert, MSN, APRN, AGNP-C Adult-Gerontology Nurse Practitioner [facility] Gastroenterology at [facility]

## 2023-03-30 NOTE — Progress Notes (Addendum)
Referring Provider: Alvina Filbert, MD Primary Care Physician:  Alvina Filbert, MD Primary GI Physician: Dr. Levon Hedger   Chief Complaint  Patient presents with   Gastroesophageal Reflux    Follow up on GERD. Has pain in mid abdomen some times and states it goes away after taking reflux med.   HPI:   Candace [name] is a 77 y.o. female with past medical history of HTN, GERD, constipation, erosive esophagitis, gastric ulcer, leiomyoma.   Patient presenting today for follow up of GERD and weight loss   Last seen August 2024, at that time,continued to lose weight. Feeling like she could eat more foods since starting omeprazole 40 mg twice daily.  Try to do more veggies, avoiding sweets and doing more whole grain bread.  She reported weight seems to go up and down.  Recommended to continue omeprazole 40 mg twice daily, high-protein diet, consider CTAP with contrast to further weight loss, check TSH.  TSh 2.34 in August   Present:  She states acid reflux Is well controlled on omeprazole 40mg  twice daily. She notes some lower abdominal cramping on occasion, she wonders if this is secondary to her omeprazole but does not happen every time she takes it. Pain resolves on its own. Denies post prandial abdominal pain. She also wonders if this is secondary to certain foods she is eating or  her potassium pills she just restarted. Appetite is good, she reports she is eating 3 meals per day. She notes that weight seems to go up and down, she weighed 103lbs at home last week. Notably down approximately 29 pounds over the past year. Denies any changes in bowel habits, no constipation, diarrhea, rectal bleeding or melena. No nausea, vomiting, early satiety, dysphagia, odynophagia   Cologuard; October 2023 negative  Last Colonoscopy:(2018) diverticulosis throughout colon, internal hemorrhoids, no polyps  Last Endoscopy:11/2022- 2 cm hiatal hernia.                           - Gastritis. Biopsied.  (Mild/chronic)                           - Patent Billroth I gastroduodenostomy was found,                            characterized by healthy appearing mucosa.                           - Normal examined duodenum   Past Medical History:  Diagnosis Date   Anxiety    Diverticulosis    Erosive esophagitis    GERD (gastroesophageal reflux disease)    Hypertension    Hypertension     Past Surgical History:  Procedure Laterality Date   ABDOMINAL HYSTERECTOMY     ABDOMINAL SURGERY     APPENDECTOMY     BIOPSY  11/22/2022   Procedure: BIOPSY;  Surgeon: Dolores Frame, MD;  [facility];  Service: Gastroenterology;;   COLONOSCOPY N/A 06/16/2016   rehman:diverticulosis throughout colon, internal hemorrhoids, no polyps   ESOPHAGOGASTRODUODENOSCOPY  11/10/2011   Rehman:patchy whitish exudate coating mucosa of distal half of the esophagus (confirmed candida). Spontaneous reflux of bile noted in distal esophagus. Small sliding HH. Small gastric pouch with patent B1 anastomosis, 7mm gastric polyp (hyperplastic gastric polyp).   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     cloNIDine (CATAPRES) 0.1 MG tablet Take 0.1 mg by mouth at bedtime as needed (anxiety).     docusate sodium (COLACE) 100 MG capsule Take 100 mg by mouth 2 (two) times daily as needed for mild constipation.     estradiol (ESTRACE) 0.1 MG/GM vaginal cream Discard plastic applicator. Insert a blueberry size amount  (approximately 1 gram) of cream on fingertip inside vagina at bedtime every night for 1 week then 2 nights per week for long term use. 42 each 3   fluticasone (FLONASE) 50 MCG/ACT nasal spray Place 1 spray into both nostrils daily. (Patient taking differently: Place 1 spray into both nostrils daily as needed for allergies.) 15.8 mL 0   hydrochlorothiazide (HYDRODIURIL) 25 MG tablet Take 25 mg by mouth daily.     meclizine (ANTIVERT) 25 MG tablet Take 1 tablet (25 mg total) by mouth 3 (three) times daily as needed for dizziness. 30 tablet 0   mirtazapine (REMERON) 15 MG tablet Take 15 mg by mouth at bedtime.     Multiple Vitamin (MULTIVITAMIN WITH MINERALS) TABS tablet Take 1 tablet by mouth daily.     OLANZapine (ZYPREXA) 5 MG tablet Take 5 mg by mouth at bedtime.     omeprazole (PRILOSEC) 40 MG capsule Take 1 capsule (40 mg total) by mouth 2 (two) times daily. 180 capsule 1   rosuvastatin (CRESTOR) 10 MG tablet Take 10 mg by mouth daily.     potassium chloride (KLOR-CON M) 10 MEQ tablet Take 10 mEq by mouth daily.     No current facility-administered medications for this visit.    Allergies as of 03/30/2023 - Review Complete 03/30/2023  Allergen Reaction Noted   Diclofenac sodium Rash 07/31/2013   Lisinopril Anaphylaxis 05/18/2022   Aspirin Itching and Swelling 02/21/2011   Penicillins Hives, Itching, and Swelling 02/21/2011    No family history on file.  Social History   Socioeconomic History   Marital status: Single    Spouse name: Not on file   Number of children: Not on file   Years of education: Not on file   Highest education level: Not on file  Occupational History   Not on file  Tobacco Use   Smoking status: Never    Passive exposure: Never   Smokeless tobacco: Former  Building services engineer status: Never Used  Substance and Sexual Activity   Alcohol use: No   Drug use: No   Sexual activity: Not Currently  Other Topics Concern   Not on file  Social History  Narrative   Not on file   Social Determinants of Health   Financial Resource Strain: Not on file  Food Insecurity: Not on file  Transportation Needs: Not on file  Physical Activity: Not on file  Stress: Not on file  Social Connections: Unknown (10/07/2021)   Received from [facility]   Social Network    Social Network: Not on file   Review of systems General: negative for malaise, night sweats,  fever, chills +weight loss  Neck: Negative for lumps, goiter, pain and significant neck swelling Resp: Negative for cough, wheezing, dyspnea at rest CV: Negative for chest pain, leg swelling, palpitations, orthopnea GI: denies melena, hematochezia, nausea, vomiting, diarrhea, constipation, dysphagia, odyonophagia, early satiety +lower abdominal pain+weight loss MSK: Negative for joint pain or swelling, back pain, and muscle pain. Derm: Negative for itching or rash Psych: Denies depression, anxiety, memory loss, confusion. No homicidal or suicidal ideation.  Heme: Negative for prolonged bleeding, bruising easily, and swollen nodes. Endocrine: Negative for cold or heat intolerance, polyuria, polydipsia and goiter. Neuro: negative for tremor, gait imbalance, syncope and seizures. The remainder of the review of systems is noncontributory.  Physical Exam: BP 115/67 (BP Location: Left Arm, Patient Position: Sitting, Cuff Size: Normal)   Pulse 80   Temp 98.1 F (36.7 C) (Oral)   Ht 5\' 4"  (1.626 m)   Wt 101 lb 9.6 oz (46.1 kg)   BMI 17.44 kg/m  General:   Alert and oriented. No distress noted. Pleasant and cooperative.  Head:  Normocephalic and atraumatic. Eyes:  Conjuctiva clear without scleral icterus. Mouth:  Oral mucosa pink and moist. Good dentition. No lesions. Heart: Normal rate and rhythm, s1 and s2 heart sounds present.  Lungs: Clear lung sounds in all lobes. Respirations equal and unlabored. Abdomen:  +BS, soft, non-tender and non-distended. No rebound or guarding.  No HSM or masses noted. Derm: No palmar erythema or jaundice Msk:  Symmetrical without gross deformities. Normal posture. Extremities:  Without edema. Neurologic:  Alert and  oriented x4 Psych:  Alert and cooperative. Normal mood and affect.  Invalid input(s): "6 MONTHS"   ASSESSMENT: Candace [name] is a 77 y.o. female presenting today for follow up of GERD and weight loss  GERD: well-controlled on omeprazole 40 mg twice daily.  Denies any breakthrough symptoms, nausea, vomiting, dysphagia, odynophagia.  Will continue with omeprazole 40 mg twice daily and good reflux precautions.  Weight loss/abdominal pain: Weight has continued to decline over the past year with approximately 29 pound weight loss.  She notes appetite is good with 3 meals per day.  She is having some lower abdominal pain that does not seem to be precipitated or alleviated by anything specific.  Denies postprandial abdominal pain, no constipation, diarrhea, rectal bleeding, melena.  Low suspicion for ischemic process in regards to her weight loss/pain.  She has had recent upper endoscopy as outlined above.  Recent TSH was within normal limits. etiology for weight loss is unclear, recommend proceeding with colonoscopy for further evaluation as I cannot rule out malignant process, lower abdominal pain can also be evaluated at this time. Indications, risks and benefits of procedure discussed in detail with patient. Patient verbalized understanding and is in agreement to proceed with Colonoscopy.    PLAN:  Continue with omeprazole 40mg  BID  2. Schedule colonoscopy  3. Continue with 3 meals per day, 1-2 protein shakes per day  All questions were answered, patient verbalized understanding and is in agreement with plan as outlined above.   Follow Up: 4 months   Candace [name] L. Jeanmarie Hubert, MSN, APRN, AGNP-C Adult-Gerontology Nurse Practitioner [facility] for [facility]  I have reviewed the note and agree with the APP's  assessment as described in this progress note  If unremarkable colonoscopy, we will proceed with a CT of the abdomen and pelvis with IV contrast.  Katrinka Blazing, MD Gastroenterology and Hepatology [facility] Gastroenterology

## 2023-04-03 ENCOUNTER — Encounter (INDEPENDENT_AMBULATORY_CARE_PROVIDER_SITE_OTHER): Payer: Self-pay

## 2023-04-11 ENCOUNTER — Encounter (INDEPENDENT_AMBULATORY_CARE_PROVIDER_SITE_OTHER): Payer: Self-pay

## 2023-04-12 ENCOUNTER — Other Ambulatory Visit (HOSPITAL_COMMUNITY): Payer: Medicare HMO

## 2023-04-18 ENCOUNTER — Telehealth (INDEPENDENT_AMBULATORY_CARE_PROVIDER_SITE_OTHER): Payer: Self-pay | Admitting: Gastroenterology

## 2023-04-18 DIAGNOSIS — N9089 Other specified noninflammatory disorders of vulva and perineum: Secondary | ICD-10-CM | POA: Insufficient documentation

## 2023-04-18 NOTE — Telephone Encounter (Signed)
Pt left voicemail needing to know when procedure is.  Returned call to pt to give her procedure info (05/02/23) but person that answered the phone states she will be back Thursday. I asked that they relay the message to return call so I could give her information.

## 2023-04-27 NOTE — Patient Instructions (Signed)
Candace [name]  04/27/2023     @PREFPERIOPPHARMACY @   Your procedure is scheduled on  05/02/2023.   Report to [facility] at  1145  A.M.   Call this number if you have problems the morning of surgery:  [phone]  If you experience any cold or flu symptoms such as cough, fever, chills, shortness of breath, etc. between now and your scheduled surgery, please notify us at the above number.   Remember:  Follow the diet and prep instructions given to you buy the office.    You may drink clear liquids until 0945 am on 05/02/2023.    Clear liquids allowed are:                    Water, Juice (No red color; non-citric and without pulp; diabetics please choose diet or no sugar options), Carbonated beverages (diabetics please choose diet or no sugar options), Clear Tea (No creamer, milk, or cream, including half & half and powdered creamer), Black Coffee Only (No creamer, milk or cream, including half & half and powdered creamer), and Clear Sports drink (No red color; diabetics please choose diet or no sugar options)    Take these medicines the morning of surgery with A SIP OF WATER                           meclizine(if needed), omeprazole.    Do not wear jewelry, make-up or nail polish, including gel polish,  artificial nails, or any other type of covering on natural nails (fingers and  toes).  Do not wear lotions, powders, or perfumes, or deodorant.  Do not shave 48 hours prior to surgery.  Men may shave face and neck.  Do not bring valuables to the hospital.  [facility] is not responsible for any belongings or valuables.  Contacts, dentures or bridgework may not be worn into surgery.  Leave your suitcase in the car.  After surgery it may be brought to your room.  For patients admitted to the hospital, discharge time will be determined by your treatment team.  Patients discharged the day of surgery will not be allowed to drive home and must have someone with them for 24  hours.    Special instructions:   DO NOT smoke tobacco or vape for 24 hours before your procedure.  Please read over the following fact sheets that you were given. Anesthesia Post-op Instructions and Care and Recovery After Surgery      Colonoscopy, Adult, Care After The following information offers guidance on how to care for yourself after your procedure. Your health care provider may also give you more specific instructions. If you have problems or questions, contact your health care provider. What can I expect after the procedure? After the procedure, it is common to have: A small amount of blood in your stool for 24 hours after the procedure. Some gas. Mild cramping or bloating of your abdomen. Follow these instructions at home: Eating and drinking  Drink enough fluid to keep your urine pale yellow. Follow instructions from your health care provider about eating or drinking restrictions. Resume your normal diet as told by your health care provider. Avoid heavy or fried foods that are hard to digest. Activity Rest as told by your health care provider. Avoid sitting for a long time without moving. Get up to take short walks every 1-2 hours. This is important to improve  blood flow and breathing. Ask for help if you feel weak or unsteady. Return to your normal activities as told by your health care provider. Ask your health care provider what activities are safe for you. Managing cramping and bloating  Try walking around when you have cramps or feel bloated. If directed, apply heat to your abdomen as told by your health care provider. Use the heat source that your health care provider recommends, such as a moist heat pack or a heating pad. Place a towel between your skin and the heat source. Leave the heat on for 20-30 minutes. Remove the heat if your skin turns bright red. This is especially important if you are unable to feel pain, heat, or cold. You have a greater risk of getting  burned. General instructions If you were given a sedative during the procedure, it can affect you for several hours. Do not drive or operate machinery until your health care provider says that it is safe. For the first 24 hours after the procedure: Do not sign important documents. Do not drink alcohol. Do your regular daily activities at a slower pace than normal. Eat soft foods that are easy to digest. Take over-the-counter and prescription medicines only as told by your health care provider. Keep all follow-up visits. This is important. Contact a health care provider if: You have blood in your stool 2-3 days after the procedure. Get help right away if: You have more than a small spotting of blood in your stool. You have large blood clots in your stool. You have swelling of your abdomen. You have nausea or vomiting. You have a fever. You have increasing pain in your abdomen that is not relieved with medicine. These symptoms may be an emergency. Get help right away. Call 911. Do not wait to see if the symptoms will go away. Do not drive yourself to the hospital. Summary After the procedure, it is common to have a small amount of blood in your stool. You may also have mild cramping and bloating of your abdomen. If you were given a sedative during the procedure, it can affect you for several hours. Do not drive or operate machinery until your health care provider says that it is safe. Get help right away if you have a lot of blood in your stool, nausea or vomiting, a fever, or increased pain in your abdomen. This information is not intended to replace advice given to you by your health care provider. Make sure you discuss any questions you have with your health care provider. Document Revised: 07/05/2022 Document Reviewed: 01/13/2021 Elsevier Patient Education  2024 [employer] Monitored Anesthesia Care, Care After The following information offers guidance on how to care for yourself  after your procedure. Your health care provider may also give you more specific instructions. If you have problems or questions, contact your health care provider. What can I expect after the procedure? After the procedure, it is common to have: Tiredness. Little or no memory about what happened during or after the procedure. Impaired judgment when it comes to making decisions. Nausea or vomiting. Some trouble with balance. Follow these instructions at home: For the time period you were told by your health care provider:  Rest. Do not participate in activities where you could fall or become injured. Do not drive or use machinery. Do not drink alcohol. Do not take sleeping pills or medicines that cause drowsiness. Do not make important decisions or sign legal documents. Do not take care  of children on your own. Medicines Take over-the-counter and prescription medicines only as told by your health care provider. If you were prescribed antibiotics, take them as told by your health care provider. Do not stop using the antibiotic even if you start to feel better. Eating and drinking Follow instructions from your health care provider about what you may eat and drink. Drink enough fluid to keep your urine pale yellow. If you vomit: Drink clear fluids slowly and in small amounts as you are able. Clear fluids include water, ice chips, low-calorie sports drinks, and fruit juice that has water added to it (diluted fruit juice). Eat light and bland foods in small amounts as you are able. These foods include bananas, applesauce, rice, lean meats, toast, and crackers. General instructions  Have a responsible adult stay with you for the time you are told. It is important to have someone help care for you until you are awake and alert. If you have sleep apnea, surgery and some medicines can increase your risk for breathing problems. Follow instructions from your health care provider about wearing your  sleep device: When you are sleeping. This includes during daytime naps. While taking prescription pain medicines, sleeping medicines, or medicines that make you drowsy. Do not use any products that contain nicotine or tobacco. These products include cigarettes, chewing tobacco, and vaping devices, such as e-cigarettes. If you need help quitting, ask your health care provider. Contact a health care provider if: You feel nauseous or vomit every time you eat or drink. You feel light-headed. You are still sleepy or having trouble with balance after 24 hours. You get a rash. You have a fever. You have redness or swelling around the IV site. Get help right away if: You have trouble breathing. You have new confusion after you get home. These symptoms may be an emergency. Get help right away. Call 911. Do not wait to see if the symptoms will go away. Do not drive yourself to the hospital. This information is not intended to replace advice given to you by your health care provider. Make sure you discuss any questions you have with your health care provider. Document Revised: 10/18/2021 Document Reviewed: 10/18/2021 Elsevier Patient Education  2024 [employer].

## 2023-04-28 ENCOUNTER — Encounter (HOSPITAL_COMMUNITY)
Admission: RE | Admit: 2023-04-28 | Discharge: 2023-04-28 | Disposition: A | Discharge status: Home / Self Care | Payer: Medicare HMO | Source: Ambulatory Visit | Attending: Gastroenterology | Admitting: Gastroenterology

## 2023-04-28 ENCOUNTER — Encounter (HOSPITAL_COMMUNITY): Payer: Self-pay

## 2023-04-28 ENCOUNTER — Other Ambulatory Visit: Payer: Self-pay

## 2023-04-28 VITALS — BP 140/68 | HR 67 | Temp 97.8°F | Resp 18 | Ht 64.0 in | Wt 101.6 lb

## 2023-04-28 DIAGNOSIS — I10 Essential (primary) hypertension: Secondary | ICD-10-CM | POA: Insufficient documentation

## 2023-04-28 DIAGNOSIS — Z01818 Encounter for other preprocedural examination: Secondary | ICD-10-CM | POA: Diagnosis present

## 2023-04-28 DIAGNOSIS — Z79899 Other long term (current) drug therapy: Secondary | ICD-10-CM | POA: Insufficient documentation

## 2023-04-28 DIAGNOSIS — R9431 Abnormal electrocardiogram [ECG] [EKG]: Secondary | ICD-10-CM | POA: Insufficient documentation

## 2023-05-01 ENCOUNTER — Telehealth (INDEPENDENT_AMBULATORY_CARE_PROVIDER_SITE_OTHER): Payer: Self-pay | Admitting: Gastroenterology

## 2023-05-01 NOTE — Telephone Encounter (Signed)
Pt called in and needing clarification. Pt has colonoscopy scheduled for tomorrow with Dr.Castaneda. pt had pre op and was told that she could eat breakfast this morning. (Pt having some confusion due to stating that pre op was yesterday). Advised pt that was to only have clear liquids beginning on 04/30/23 after 2pm. Pt states she has ate breakfast and lunch. She got her prep out and was looking at her instructions and stated she had messed up because she ate. Advised pt that we would need to reschedule her. Offered her 06/01/23 but pt does not want to be on clear liquid on Christmas. Will call pt with January schedule or if cancellation occurs.

## 2023-05-01 NOTE — Telephone Encounter (Signed)
Thanks for the update

## 2023-05-02 ENCOUNTER — Ambulatory Visit (HOSPITAL_COMMUNITY): Admission: RE | Admit: 2023-05-02 | Payer: Medicare HMO | Source: Home / Self Care | Admitting: Gastroenterology

## 2023-05-02 ENCOUNTER — Encounter (HOSPITAL_COMMUNITY): Admission: RE | Payer: Self-pay | Source: Home / Self Care

## 2023-05-02 SURGERY — COLONOSCOPY WITH PROPOFOL
Anesthesia: Monitor Anesthesia Care

## 2023-05-30 ENCOUNTER — Other Ambulatory Visit (INDEPENDENT_AMBULATORY_CARE_PROVIDER_SITE_OTHER): Payer: Self-pay | Admitting: Gastroenterology

## 2023-06-12 DIAGNOSIS — N390 Urinary tract infection, site not specified: Secondary | ICD-10-CM | POA: Insufficient documentation

## 2023-06-12 NOTE — Progress Notes (Deleted)
 Name: Candace [name] DOB: [birth year] MRN: [id number]  History of Present Illness: Ms. Candace [name] is a 78 y.o. female who presents today for follow up visit at [facility]. ***She is accompanied by ***. - GU / GYN History: 1. Vaginal atrophy. 2. Lichen sclerosus. 3. Distant history of kidney stones (1970).  4. Surgical history includes hysterectomy (1974). 5. Intermittent constipation. Takes Miralax PRN.  6. Not sexually active.   She reports chief complaint of recurrent UTls.    Urine culture results in past 12 months: - 03/18/2022: Negative - 06/23/2022: Positive for >100k E. coli - 09/22/2022: Positive for >100k E. Coli ***No additional urine culture results in past 12 months found per chart review (on ***06/12/23)  At initial visit on 12/15/2022: The plan was: 1. Start topical vaginal estrogen cream at least 2 nights per week. 2. Adequate fluid intake (>1.5 liters/day) to flush out the urinary tract. 3. Urinate every 4-6 hours while awake to minimize urinary stasis/ bacterial overgrowth in the bladder. 4. Consider OTC supplements for UTI prevention. 5. Return in about 6 months (around 06/17/2023) for UA, PVR, & f/u with Lauraine Oz NP.  Since last visit: > 04/18/2023: Seen by GYN provider. Doing much better with the estrogen cream. Still has nodule on labia and would like to have it checked out.  > 05/01/2023: Seen by GYN for 4 cm solid mass at right groin/upper mons.   > 05/25/2023: Pelvic ultrasound showed Hypoechoic solid mass in the right groin measuring up to 2.1 cm, highly concerning for malignancy, specifically metastatic lymphadenopathy.   ***she is scheduled for sentinel lymph node biopsy by IR on 07/07/2023 at [facility] in [place].  Today: She reports *** UTIs since last visit.  She {Actions; denies-reports:120008} increased urinary urgency, frequency, dysuria, gross hematuria, straining to void, or sensations of incomplete emptying.   Fall  Screening: Do you usually have a device to assist in your mobility? {yes/no:20286} ***cane / ***walker / ***wheelchair  Medications: Current Outpatient Medications  Medication Sig Dispense Refill   acetaminophen  (TYLENOL ) 500 MG tablet Take 500 mg by mouth every 6 (six) hours as needed for moderate pain.     cloNIDine (CATAPRES) 0.1 MG tablet Take 0.1 mg by mouth at bedtime as needed (anxiety).     docusate sodium (COLACE) 100 MG capsule Take 100 mg by mouth daily as needed for mild constipation.     estradiol  (ESTRACE ) 0.1 MG/GM vaginal cream Discard plastic applicator. Insert a blueberry size amount (approximately 1 gram) of cream on fingertip inside vagina at bedtime every night for 1 week then 2 nights per week for long term use. 42 each 3   fluticasone  (FLONASE ) 50 MCG/ACT nasal spray Place 1 spray into both nostrils daily. (Patient taking differently: Place 1 spray into both nostrils daily as needed for allergies.) 15.8 mL 0   hydrochlorothiazide (HYDRODIURIL) 25 MG tablet Take 25 mg by mouth daily.     meclizine  (ANTIVERT ) 25 MG tablet Take 1 tablet (25 mg total) by mouth 3 (three) times daily as needed for dizziness. 30 tablet 0   mirtazapine (REMERON) 15 MG tablet Take 15 mg by mouth at bedtime.     Multiple Vitamin (MULTIVITAMIN WITH MINERALS) TABS tablet Take 1 tablet by mouth daily.     OLANZapine (ZYPREXA) 5 MG tablet Take 5 mg by mouth at bedtime.     omeprazole  (PRILOSEC) 40 MG capsule TAKE ONE CAPSULE BY MOUTH TWICE DAILY 180 capsule 1   potassium chloride  (KLOR-CON  M)  10 MEQ tablet Take 10 mEq by mouth daily. Not taking     rosuvastatin (CRESTOR) 10 MG tablet Take 10 mg by mouth daily.     No current facility-administered medications for this visit.    Allergies: Allergies  Allergen Reactions   Diclofenac Sodium Rash   Lisinopril Anaphylaxis   Aspirin Itching and Swelling   Penicillins Hives, Itching and Swelling    Has patient had a PCN reaction causing immediate rash,  facial/tongue/throat swelling, SOB or lightheadedness with hypotension: Yes Has patient had a PCN reaction causing severe rash involving mucus membranes or skin necrosis: Yes Has patient had a PCN reaction that required hospitalization No Has patient had a PCN reaction occurring within the last 10 years: No If all of the above answers are NO, then may proceed with Cephalosporin use.     Past Medical History:  Diagnosis Date   Anxiety    Diverticulosis    Erosive esophagitis    GERD (gastroesophageal reflux disease)    Hypertension    Hypertension    Past Surgical History:  Procedure Laterality Date   ABDOMINAL HYSTERECTOMY     ABDOMINAL SURGERY     APPENDECTOMY     BIOPSY  11/22/2022   Procedure: BIOPSY;  Surgeon: Eartha Angelia Sieving, MD;  Location: [facility];  Service: Gastroenterology;;   COLONOSCOPY N/A 06/16/2016   rehman:diverticulosis throughout colon, internal hemorrhoids, no polyps   ESOPHAGOGASTRODUODENOSCOPY  11/10/2011   Rehman:patchy whitish exudate coating mucosa of distal half of the esophagus (confirmed candida). Spontaneous reflux of bile noted in distal esophagus. Small sliding HH. Small gastric pouch with patent B1 anastomosis, 7mm gastric polyp (hyperplastic gastric polyp).   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 Social History   Socioeconomic History   Marital status: Single    Spouse name: Not on file   Number of children: Not on file   Years of education: Not on file   Highest education level: Not on file  Occupational History   Not on file  Tobacco Use   Smoking status: Never    Passive  exposure: Never   Smokeless tobacco: Former  Building Services Engineer status: Never Used  Substance and Sexual Activity   Alcohol use: No   Drug use: No   Sexual activity: Not Currently  Other Topics Concern   Not on file  Social History Narrative   Not on file   Social Drivers of Health   Financial Resource Strain: Not on file  Food Insecurity: Not on file  Transportation Needs: Not on file  Physical Activity: Not on file  Stress: Not on file  Social Connections: Unknown (10/07/2021)   Received from [facility], [facility]   Social Network    Social Network: Not on file  Intimate Partner Violence: Unknown (09/08/2021)   Received from [facility], [facility]   HITS    Physically Hurt: Not on file    Insult or Talk Down To: Not on file    Threaten Physical Harm: Not on file    Scream or Curse: Not on file    Review of Systems Constitutional: Patient denies any unintentional  weight loss or change in strength lntegumentary: Patient denies any rashes or pruritus Cardiovascular: Patient denies chest pain or syncope Respiratory: Patient denies shortness of breath Gastrointestinal: Patient ***denies nausea, vomiting, constipation, or diarrhea Musculoskeletal: Patient denies muscle cramps or weakness Neurologic: Patient denies convulsions or seizures Allergic/Immunologic: Patient denies recent allergic reaction(s) Hematologic/Lymphatic: Patient denies bleeding tendencies Endocrine: Patient denies heat/cold intolerance  GU: As per HPI.  OBJECTIVE There were no vitals filed for this visit. There is no height or weight on file to calculate BMI.  Physical Examination Constitutional: No obvious distress; patient is non-toxic appearing  Cardiovascular: No visible lower extremity edema.  Respiratory: The patient does not have audible wheezing/stridor; respirations do not appear labored  Gastrointestinal: Abdomen non-distended Musculoskeletal: Normal ROM of UEs  Skin: No  obvious rashes/open sores  Neurologic: CN 2-12 grossly intact Psychiatric: Answered questions appropriately with normal affect  Hematologic/Lymphatic/Immunologic: No obvious bruises or sites of spontaneous bleeding  Urine microscopy: ***negative *** WBC/hpf, *** RBC/hpf, *** bacteria UA: ***negative *** WBC/hpf, *** RBC/hpf, *** bacteria ***with no evidence of UTI ***with no evidence of microscopic hematuria ***otherwise unremarkable  PVR: *** ml  ASSESSMENT No diagnosis found. ***  Will plan for follow up in *** months / ***1 year or sooner if needed. Pt verbalized understanding and agreement. All questions were answered.  PLAN Advised the following: 1. *** 2. ***No follow-ups on file.  No orders of the defined types were placed in this encounter.   It has been explained that the patient is to follow regularly with their PCP in addition to all other providers involved in their care and to follow instructions provided by these respective offices. Patient advised to contact urology clinic if any urologic-pertaining questions, concerns, new symptoms or problems arise in the interim period.  There are no Patient Instructions on file for this visit.  Electronically signed by:  Lauraine JAYSON Oz, FNP   06/12/23    2:55 PM

## 2023-06-15 ENCOUNTER — Ambulatory Visit: Payer: Medicare HMO | Admitting: Urology

## 2023-06-15 NOTE — Progress Notes (Deleted)
 Name: Candace [name] DOB: 27-Jan-1946 MRN: [id number]  History of Present Illness: Candace [name] is a 78 y.o. female who presents today for follow up visit at [facility] [place]. ***She is accompanied by ***. - GU / GYN History: 1. Vaginal atrophy. 2. Lichen sclerosus. 3. Distant history of kidney stones (1970).  4. Surgical history includes hysterectomy (1974). 5. Intermittent constipation. Takes Miralax PRN.  6. Not sexually active.   She reports chief complaint of recurrent UTls.    Urine culture results in past 12 months: - 03/18/2022: Negative - 06/23/2022: Positive for >100k E. coli - 09/22/2022: Positive for >100k E. Coli ***No additional urine culture results in past 12 months found per chart review (on ***06/12/23)  At initial visit on 12/15/2022: The plan was: 1. Start topical vaginal estrogen cream at least 2 nights per week. 2. Adequate fluid intake (>1.5 liters/day) to flush out the urinary tract. 3. Urinate every 4-6 hours while awake to minimize urinary stasis/ bacterial overgrowth in the bladder. 4. Consider OTC supplements for UTI prevention. 5. Return in about 6 months (around 06/17/2023) for UA, PVR, & f/u with Lauraine Oz NP.  Since last visit: > 04/18/2023: Seen by GYN provider. Doing much better with the estrogen cream. Still has nodule on labia and would like to have it checked out.  > 05/01/2023: Seen by GYN for 4 cm solid mass at right groin/upper mons.   > 05/25/2023: Pelvic ultrasound showed Hypoechoic solid mass in the right groin measuring up to 2.1 cm, highly concerning for malignancy, specifically metastatic lymphadenopathy.   ***she is scheduled for sentinel lymph node biopsy by IR on 07/07/2023 at [facility] in [place].  Today: She reports *** UTIs since last visit.  She {Actions; denies-reports:120008} increased urinary urgency, frequency, dysuria, gross hematuria, straining to void, or sensations of incomplete emptying.   Fall  Screening: Do you usually have a device to assist in your mobility? {yes/no:20286} ***cane / ***walker / ***wheelchair  Medications: Current Outpatient Medications  Medication Sig Dispense Refill   acetaminophen  (TYLENOL ) 500 MG tablet Take 500 mg by mouth every 6 (six) hours as needed for moderate pain.     cloNIDine (CATAPRES) 0.1 MG tablet Take 0.1 mg by mouth at bedtime as needed (anxiety).     docusate sodium (COLACE) 100 MG capsule Take 100 mg by mouth daily as needed for mild constipation.     estradiol  (ESTRACE ) 0.1 MG/GM vaginal cream Discard plastic applicator. Insert a blueberry size amount (approximately 1 gram) of cream on fingertip inside vagina at bedtime every night for 1 week then 2 nights per week for long term use. 42 each 3   fluticasone  (FLONASE ) 50 MCG/ACT nasal spray Place 1 spray into both nostrils daily. (Patient taking differently: Place 1 spray into both nostrils daily as needed for allergies.) 15.8 mL 0   hydrochlorothiazide (HYDRODIURIL) 25 MG tablet Take 25 mg by mouth daily.     meclizine  (ANTIVERT ) 25 MG tablet Take 1 tablet (25 mg total) by mouth 3 (three) times daily as needed for dizziness. 30 tablet 0   mirtazapine (REMERON) 15 MG tablet Take 15 mg by mouth at bedtime.     Multiple Vitamin (MULTIVITAMIN WITH MINERALS) TABS tablet Take 1 tablet by mouth daily.     OLANZapine (ZYPREXA) 5 MG tablet Take 5 mg by mouth at bedtime.     omeprazole  (PRILOSEC) 40 MG capsule TAKE ONE CAPSULE BY MOUTH TWICE DAILY 180 capsule 1   potassium chloride  (KLOR-CON  M)  10 MEQ tablet Take 10 mEq by mouth daily. Not taking     rosuvastatin (CRESTOR) 10 MG tablet Take 10 mg by mouth daily.     No current facility-administered medications for this visit.    Allergies: Allergies  Allergen Reactions   Diclofenac Sodium Rash   Lisinopril Anaphylaxis   Aspirin Itching and Swelling   Penicillins Hives, Itching and Swelling    Has patient had a PCN reaction causing immediate rash,  facial/tongue/throat swelling, SOB or lightheadedness with hypotension: Yes Has patient had a PCN reaction causing severe rash involving mucus membranes or skin necrosis: Yes Has patient had a PCN reaction that required hospitalization No Has patient had a PCN reaction occurring within the last 10 years: No If all of the above answers are NO, then may proceed with Cephalosporin use.     Past Medical History:  Diagnosis Date   Anxiety    Diverticulosis    Erosive esophagitis    GERD (gastroesophageal reflux disease)    Hypertension    Hypertension    Past Surgical History:  Procedure Laterality Date   ABDOMINAL HYSTERECTOMY     ABDOMINAL SURGERY     APPENDECTOMY     BIOPSY  11/22/2022   Procedure: BIOPSY;  Surgeon: Eartha Angelia Sieving, MD;  Location: [facility];  Service: Gastroenterology;;   COLONOSCOPY N/A 06/16/2016   rehman:diverticulosis throughout colon, internal hemorrhoids, no polyps   ESOPHAGOGASTRODUODENOSCOPY  11/10/2011   Rehman:patchy whitish exudate coating mucosa of distal half of the esophagus (confirmed candida). Spontaneous reflux of bile noted in distal esophagus. Small sliding HH. Small gastric pouch with patent B1 anastomosis, 7mm gastric polyp (hyperplastic gastric polyp).   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 Social History   Socioeconomic History   Marital status: Single    Spouse name: Not on file   Number of children: Not on file   Years of education: Not on file   Highest education level: Not on file  Occupational History   Not on file  Tobacco Use   Smoking status: Never    Passive  exposure: Never   Smokeless tobacco: Former  Building Services Engineer status: Never Used  Substance and Sexual Activity   Alcohol use: No   Drug use: No   Sexual activity: Not Currently  Other Topics Concern   Not on file  Social History Narrative   Not on file   Social Drivers of Health   Financial Resource Strain: Not on file  Food Insecurity: Not on file  Transportation Needs: Not on file  Physical Activity: Not on file  Stress: Not on file  Social Connections: Unknown (10/07/2021)   Received from [facility]   Social Network    Social Network: Not on file  Intimate Partner Violence: Unknown (09/08/2021)   [facility] from [facility]   HITS    Physically Hurt: Not on file    Insult or Talk Down To: Not on file    Threaten Physical Harm: Not on file    Scream or Curse: Not on file    Review of Systems Constitutional: Patient denies any unintentional  weight loss or change in strength lntegumentary: Patient denies any rashes or pruritus Cardiovascular: Patient denies chest pain or syncope Respiratory: Patient denies shortness of breath Gastrointestinal: Patient ***denies nausea, vomiting, constipation, or diarrhea Musculoskeletal: Patient denies muscle cramps or weakness Neurologic: Patient denies convulsions or seizures Allergic/Immunologic: Patient denies recent allergic reaction(s) Hematologic/Lymphatic: Patient denies bleeding tendencies Endocrine: Patient denies heat/cold intolerance  GU: As per HPI.  OBJECTIVE There were no vitals filed for this visit. There is no height or weight on file to calculate BMI.  Physical Examination Constitutional: No obvious distress; patient is non-toxic appearing  Cardiovascular: No visible lower extremity edema.  Respiratory: The patient does not have audible wheezing/stridor; respirations do not appear labored  Gastrointestinal: Abdomen non-distended Musculoskeletal: Normal ROM of UEs  Skin: No  obvious rashes/open sores  Neurologic: CN 2-12 grossly intact Psychiatric: Answered questions appropriately with normal affect  Hematologic/Lymphatic/Immunologic: No obvious bruises or sites of spontaneous bleeding  Urine microscopy: ***negative *** WBC/hpf, *** RBC/hpf, *** bacteria UA: ***negative *** WBC/hpf, *** RBC/hpf, *** bacteria ***with no evidence of UTI ***with no evidence of microscopic hematuria ***otherwise unremarkable  PVR: *** ml  ASSESSMENT Recurrent UTI  Atrophic vaginitis ***  Will plan for follow up in *** months / ***1 year or sooner if needed. Pt verbalized understanding and agreement. All questions were answered.  PLAN Advised the following: 1. *** 2. ***No follow-ups on file.  No orders of the defined types were placed in this encounter.   It has been explained that the patient is to follow regularly with their PCP in addition to all other providers involved in their care and to follow instructions provided by these respective offices. Patient advised to contact urology clinic if any urologic-pertaining questions, concerns, new symptoms or problems arise in the interim period.  There are no Patient Instructions on file for this visit.  Electronically signed by:  Lauraine JAYSON Oz, FNP   06/15/23    8:19 AM

## 2023-06-16 ENCOUNTER — Ambulatory Visit: Payer: Medicare HMO | Admitting: Urology

## 2023-06-16 DIAGNOSIS — N952 Postmenopausal atrophic vaginitis: Secondary | ICD-10-CM

## 2023-06-16 DIAGNOSIS — L900 Lichen sclerosus et atrophicus: Secondary | ICD-10-CM

## 2023-06-16 DIAGNOSIS — N390 Urinary tract infection, site not specified: Secondary | ICD-10-CM

## 2023-07-09 ENCOUNTER — Other Ambulatory Visit: Payer: Self-pay

## 2023-07-09 ENCOUNTER — Encounter (HOSPITAL_COMMUNITY): Payer: Self-pay | Admitting: Emergency Medicine

## 2023-07-09 ENCOUNTER — Emergency Department (HOSPITAL_COMMUNITY)
Admission: EM | Admit: 2023-07-09 | Discharge: 2023-07-09 | Disposition: A | Discharge status: Home / Self Care | Payer: Medicare HMO | Attending: Emergency Medicine | Admitting: Emergency Medicine

## 2023-07-09 DIAGNOSIS — M791 Myalgia, unspecified site: Secondary | ICD-10-CM | POA: Insufficient documentation

## 2023-07-09 DIAGNOSIS — I10 Essential (primary) hypertension: Secondary | ICD-10-CM | POA: Insufficient documentation

## 2023-07-09 DIAGNOSIS — R252 Cramp and spasm: Secondary | ICD-10-CM | POA: Diagnosis not present

## 2023-07-09 DIAGNOSIS — D72819 Decreased white blood cell count, unspecified: Secondary | ICD-10-CM | POA: Diagnosis not present

## 2023-07-09 DIAGNOSIS — Z79899 Other long term (current) drug therapy: Secondary | ICD-10-CM | POA: Insufficient documentation

## 2023-07-09 LAB — COMPREHENSIVE METABOLIC PANEL
ALT: 37 U/L (ref 0–44)
AST: 37 U/L (ref 15–41)
Albumin: 3.5 g/dL (ref 3.5–5.0)
Alkaline Phosphatase: 67 U/L (ref 38–126)
Anion gap: 6 (ref 5–15)
BUN: 15 mg/dL (ref 8–23)
CO2: 31 mmol/L (ref 22–32)
Calcium: 9.2 mg/dL (ref 8.9–10.3)
Chloride: 104 mmol/L (ref 98–111)
Creatinine, Ser: 0.78 mg/dL (ref 0.44–1.00)
GFR, Estimated: >60 mL/min (ref >60)
Glucose, Bld: 95 mg/dL (ref 70–99)
Potassium: 3.6 mmol/L (ref 3.5–5.1)
Sodium: 141 mmol/L (ref 135–145)
Total Bilirubin: 0.4 mg/dL (ref 0.0–1.2)
Total Protein: 7.1 g/dL (ref 6.5–8.1)

## 2023-07-09 LAB — CBC WITH DIFFERENTIAL/PLATELET
Abs Immature Granulocytes: 0 10*3/uL (ref 0.00–0.07)
Basophils Absolute: 0 10*3/uL (ref 0.0–0.1)
Basophils Relative: 1 %
Eosinophils Absolute: 0.1 10*3/uL (ref 0.0–0.5)
Eosinophils Relative: 4 %
HCT: 34.4 % — ABNORMAL LOW (ref 36.0–46.0)
Hemoglobin: 10.8 g/dL — ABNORMAL LOW (ref 12.0–15.0)
Immature Granulocytes: 0 %
Lymphocytes Relative: 33 %
Lymphs Abs: 1.1 10*3/uL (ref 0.7–4.0)
MCH: 29.3 pg (ref 26.0–34.0)
MCHC: 31.4 g/dL (ref 30.0–36.0)
MCV: 93.5 fL (ref 80.0–100.0)
Monocytes Absolute: 0.8 10*3/uL (ref 0.1–1.0)
Monocytes Relative: 24 %
Neutro Abs: 1.2 10*3/uL — ABNORMAL LOW (ref 1.7–7.7)
Neutrophils Relative %: 38 %
Platelets: 150 10*3/uL (ref 150–400)
RBC: 3.68 MIL/uL — ABNORMAL LOW (ref 3.87–5.11)
RDW: 14.4 % (ref 11.5–15.5)
WBC: 3.2 10*3/uL — ABNORMAL LOW (ref 4.0–10.5)
nRBC: 0 % (ref 0.0–0.2)

## 2023-07-09 LAB — MAGNESIUM: Magnesium: 2.1 mg/dL (ref 1.7–2.4)

## 2023-07-09 LAB — CK: Total CK: 211 U/L (ref 38–234)

## 2023-07-09 NOTE — Discharge Instructions (Signed)
Lab work today was reassuring.  Ensure that you keep your scheduled outpatient appointments.  Return to the emergency department for any new or worsening symptoms of concern.

## 2023-07-09 NOTE — ED Provider Notes (Signed)
[place] EMERGENCY DEPARTMENT AT [facility] Provider Note   CSN: [id number] Arrival date & time: 07/09/23  0002     History  Chief Complaint  Patient presents with   Leg Cramps    Candace [name] is a 78 y.o. female.  HPI Patient presents for leg cramps.  Medical history includes HLD, HTN, GERD, anxiety.  She has recently undergone workup for a right groin mass.  Ultrasound findings are concerning for malignancy.  Patient states that she had soreness in the lateral aspects of her upper legs starting at 9 PM.  This resolved after couple hours tonight.  While she was in the waiting room, symptoms had resolved.  She denies any current symptoms.  She states that she had similar pain several years ago and was found to have low potassium at the time.  This is her main concern tonight.    Home Medications Prior to Admission medications   Medication Sig Start Date End Date Taking? Authorizing Provider  acetaminophen (TYLENOL) 500 MG tablet Take 500 mg by mouth every 6 (six) hours as needed for moderate pain.    [provider]  cloNIDine (CATAPRES) 0.1 MG tablet Take 0.1 mg by mouth at bedtime as needed (anxiety).    [provider]  docusate sodium (COLACE) 100 MG capsule Take 100 mg by mouth daily as needed for mild constipation.    [provider]  estradiol (ESTRACE) 0.1 MG/GM vaginal cream Discard plastic applicator. Insert a blueberry size amount (approximately 1 gram) of cream on fingertip inside vagina at bedtime every night for 1 week then 2 nights per week for long term use. 12/15/22   Deliah [name], Eleonore Chiquito, FNP  fluticasone (FLONASE) 50 MCG/ACT nasal spray Place 1 spray into both nostrils daily. Patient taking differently: Place 1 spray into both nostrils daily as needed for allergies. 05/21/22   Jacalyn Lefevre, MD  hydrochlorothiazide (HYDRODIURIL) 25 MG tablet Take 25 mg by mouth daily.    [provider]  meclizine (ANTIVERT) 25 MG tablet  Take 1 tablet (25 mg total) by mouth 3 (three) times daily as needed for dizziness. 01/17/22   Gilda Crease, MD  mirtazapine (REMERON) 15 MG tablet Take 15 mg by mouth at bedtime.    [provider]  Multiple Vitamin (MULTIVITAMIN WITH MINERALS) TABS tablet Take 1 tablet by mouth daily.    [provider]  OLANZapine (ZYPREXA) 5 MG tablet Take 5 mg by mouth at bedtime.    [provider]  omeprazole (PRILOSEC) 40 MG capsule TAKE ONE CAPSULE BY MOUTH TWICE DAILY 06/01/23   Marguerita Merles, Reuel Boom, MD  potassium chloride (KLOR-CON M) 10 MEQ tablet Take 10 mEq by mouth daily. Not taking    [provider]  rosuvastatin (CRESTOR) 10 MG tablet Take 10 mg by mouth daily.    [provider]  divalproex (DEPAKOTE) 250 MG EC tablet Take 250 mg by mouth 2 (two) times daily.    08/29/11  [provider]      Allergies    Diclofenac sodium, Lisinopril, Aspirin, and Penicillins    Review of Systems   Review of Systems  Musculoskeletal:  Positive for myalgias.  All other systems reviewed and are negative.   Physical Exam Updated Vital Signs BP 130/73 (BP Location: Left Arm)   Pulse 89   Temp 98.2 F (36.8 C) (Oral)   Resp 16   Ht 5\' 4"  (1.626 m)   Wt 46.1 kg   SpO2 100%  BMI 17.45 kg/m  Physical Exam Vitals and nursing note reviewed.  Constitutional:      General: She is not in acute distress.    Appearance: Normal appearance. She is well-developed. She is not ill-appearing, toxic-appearing or diaphoretic.  HENT:     Head: Normocephalic and atraumatic.     Right Ear: External ear normal.     Left Ear: External ear normal.     Nose: Nose normal.     Mouth/Throat:     Mouth: Mucous membranes are moist.  Eyes:     Extraocular Movements: Extraocular movements intact.     Conjunctiva/sclera: Conjunctivae normal.  Cardiovascular:     Rate and Rhythm: Normal rate and regular rhythm.  Pulmonary:     Effort: Pulmonary effort  is normal. No respiratory distress.  Abdominal:     General: There is no distension.     Palpations: Abdomen is soft.  Musculoskeletal:        General: No swelling, tenderness or deformity. Normal range of motion.     Cervical back: Normal range of motion and neck supple.  Skin:    General: Skin is warm and dry.  Neurological:     General: No focal deficit present.     Mental Status: She is alert and oriented to person, place, and time.  Psychiatric:        Mood and Affect: Mood normal.        Behavior: Behavior normal.     ED Results / Procedures / Treatments   Labs (all labs ordered are listed, but only abnormal results are displayed) Labs Reviewed  CBC WITH DIFFERENTIAL/PLATELET - Abnormal; Notable for the following components:      Result Value   WBC 3.2 (*)    RBC 3.68 (*)    Hemoglobin 10.8 (*)    HCT 34.4 (*)    Neutro Abs 1.2 (*)    All other components within normal limits  COMPREHENSIVE METABOLIC PANEL  MAGNESIUM  CK    EKG None  Radiology No results found.  Procedures Procedures    Medications Ordered in ED Medications - No data to display  ED Course/ Medical Decision Making/ A&P                                 Medical Decision Making Amount and/or Complexity of Data Reviewed Labs: ordered.   This patient presents to the ED for concern of myalgias, this involves an extensive number of treatment options, and is a complaint that carries with it a high risk of complications and morbidity.  The differential diagnosis includes myositis, electrolyte abnormalities, dehydration, neoplasm   Co morbidities that complicate the patient evaluation  HLD, HTN, GERD, anxiety   Additional history obtained:  Additional history obtained from N/A External records from outside source obtained and reviewed including EMR   Lab Tests:  I Ordered, and personally interpreted labs.  The pertinent results include: Hemoglobin is mildly decreased from lab work  from 8 months ago.  There is a mild leukopenia.  Kidney function and electrolytes are normal.  Problem List / ED Course / Critical interventions / Medication management  Patient presents for transient pain in lateral aspects of bilateral thighs.  On assessment, she denies any symptoms.  She is concerned of low potassium which has led to similar symptoms in the past.  On exam, she is overall well-appearing.  She has no tenderness in areas of  concern.  Per chart review, she was recently diagnosed with inguinal lymphadenopathy, concerning for neoplasm.  Currently, her legs are not swollen.  I do not suspect compressive effects of these lymph nodes on vasculature.  Laboratory workup was initiated.  Electrolytes were found to be normal.  Patient has decreased hemoglobin and WBC when compared to lab work from 8 months ago.  CK was normal.  Patient remained asymptomatic while in the ED.  She was discharged in stable condition.   Social Determinants of Health:  Has access to outpatient care        Final Clinical Impression(s) / ED Diagnoses Final diagnoses:  Myalgia    Rx / DC Orders ED Discharge Orders     None         Gloris Manchester, MD 07/09/23 [phone]

## 2023-07-09 NOTE — ED Triage Notes (Signed)
Pt states that approximately 2hrs ago she started having cramping to her bilateral thigh areas.

## 2023-07-13 ENCOUNTER — Ambulatory Visit: Payer: Medicare HMO | Admitting: Urology

## 2023-07-20 ENCOUNTER — Ambulatory Visit: Payer: Medicare HMO | Admitting: Urology

## 2023-07-20 ENCOUNTER — Encounter: Payer: Self-pay | Admitting: Urology

## 2023-07-20 VITALS — BP 140/76 | HR 66

## 2023-07-20 DIAGNOSIS — N952 Postmenopausal atrophic vaginitis: Secondary | ICD-10-CM

## 2023-07-20 DIAGNOSIS — L900 Lichen sclerosus et atrophicus: Secondary | ICD-10-CM | POA: Diagnosis not present

## 2023-07-20 DIAGNOSIS — Z87440 Personal history of urinary (tract) infections: Secondary | ICD-10-CM | POA: Diagnosis not present

## 2023-07-20 DIAGNOSIS — N390 Urinary tract infection, site not specified: Secondary | ICD-10-CM

## 2023-07-20 LAB — BLADDER SCAN AMB NON-IMAGING: Scan Result: 19

## 2023-07-20 NOTE — Progress Notes (Signed)
Name: Candace [name] DOB: 1946-03-15 MRN: [id number]  History of Present Illness: Ms. Candace [name] is a 78 y.o. female who presents today for follow up visit at [facility] [place].  - GU / GYN History: 1. Recurrent UTIs. 2. Vaginal atrophy. 3. Lichen sclerosus. 4. Distant history of kidney stones (1970).  5. Surgical history includes hysterectomy (1974). 6. Intermittent constipation. Takes Miralax PRN.  7. Not sexually active.   At initial visit on 12/15/2022: The plan was: 1. Start topical vaginal estrogen cream at least 2 nights per week. 2. Adequate fluid intake (>1.5 liters/day) to flush out the urinary tract. 3. Urinate every 4-6 hours while awake to minimize urinary stasis/ bacterial overgrowth in the bladder. 4. Consider OTC supplements for UTI prevention. 5. Return in about 6 months (around 06/17/2023) for UA, PVR, & f/u with Evette Georges NP.  Since last visit: > 04/18/2023: Seen by GYN provider. "Doing much better with the estrogen cream. Still has nodule on labia and would like to have it checked out."  > 05/01/2023: Seen by GYN for 4 cm solid mass at right groin/upper mons.   > 05/25/2023: Pelvic ultrasound showed "Hypoechoic solid mass in the right groin measuring up to 2.1 cm, highly concerning for malignancy, specifically metastatic lymphadenopathy."   Patient reports that she is scheduled for a biopsy on 08/17/2023 at [facility].  Today: She reports 0 UTIs since last visit. Denies any UTI symptoms or acute urinary concerns at this time. Denies urinary urgency, frequency, dysuria, gross hematuria, straining to void, or sensations of incomplete emptying. She has been using vaginal estrogen cream at a frequency of 2 time(s) per week.   Fall Screening: Do you usually have a device to assist in your mobility? No   Medications: Current Outpatient Medications  Medication Sig Dispense Refill   acetaminophen (TYLENOL) 500 MG tablet Take 500 mg by mouth every 6 (six)  hours as needed for moderate pain.     cloNIDine (CATAPRES) 0.1 MG tablet Take 0.1 mg by mouth at bedtime as needed (anxiety).     docusate sodium (COLACE) 100 MG capsule Take 100 mg by mouth daily as needed for mild constipation.     estradiol (ESTRACE) 0.1 MG/GM vaginal cream Discard plastic applicator. Insert a blueberry size amount (approximately 1 gram) of cream on fingertip inside vagina at bedtime every night for 1 week then 2 nights per week for long term use. 42 each 3   fluticasone (FLONASE) 50 MCG/ACT nasal spray Place 1 spray into both nostrils daily. (Patient taking differently: Place 1 spray into both nostrils daily as needed for allergies.) 15.8 mL 0   hydrochlorothiazide (MICROZIDE) 12.5 MG capsule Take 12.5 mg by mouth daily.     meclizine (ANTIVERT) 25 MG tablet Take 1 tablet (25 mg total) by mouth 3 (three) times daily as needed for dizziness. 30 tablet 0   mirtazapine (REMERON) 15 MG tablet Take 15 mg by mouth at bedtime.     Multiple Vitamin (MULTIVITAMIN WITH MINERALS) TABS tablet Take 1 tablet by mouth daily.     OLANZapine (ZYPREXA) 5 MG tablet Take 5 mg by mouth at bedtime.     omeprazole (PRILOSEC) 40 MG capsule TAKE ONE CAPSULE BY MOUTH TWICE DAILY 180 capsule 1   potassium chloride (KLOR-CON M) 10 MEQ tablet Take 10 mEq by mouth daily. Not taking     rosuvastatin (CRESTOR) 10 MG tablet Take 10 mg by mouth daily.     No current facility-administered medications for this visit.  Allergies: Allergies  Allergen Reactions   Diclofenac Sodium Rash   Lisinopril Anaphylaxis   Aspirin Itching and Swelling   Penicillins Hives, Itching and Swelling    Has patient had a PCN reaction causing immediate rash, facial/tongue/throat swelling, SOB or lightheadedness with hypotension: Yes Has patient had a PCN reaction causing severe rash involving mucus membranes or skin necrosis: Yes Has patient had a PCN reaction that required hospitalization No Has patient had a PCN reaction  occurring within the last 10 years: No If all of the above answers are "NO", then may proceed with Cephalosporin use.     Past Medical History:  Diagnosis Date   Anxiety    Diverticulosis    Erosive esophagitis    GERD (gastroesophageal reflux disease)    Hypertension    Hypertension    Past Surgical History:  Procedure Laterality Date   ABDOMINAL HYSTERECTOMY     ABDOMINAL SURGERY     APPENDECTOMY     BIOPSY  11/22/2022   Procedure: BIOPSY;  Surgeon: Dolores Frame, MD;  Location: [facility];  Service: Gastroenterology;;   COLONOSCOPY N/A 06/16/2016   rehman:diverticulosis throughout colon, internal hemorrhoids, no polyps   ESOPHAGOGASTRODUODENOSCOPY  11/10/2011   Rehman:patchy whitish exudate coating mucosa of distal half of the esophagus (confirmed candida). Spontaneous reflux of bile noted in distal esophagus. Small sliding HH. Small gastric pouch with patent B1 anastomosis, 7mm gastric polyp (hyperplastic gastric polyp).   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 No pertinent family history. Social History   Socioeconomic History   Marital status: Single    Spouse name: Not on file   Number of children: Not on file   Years of education: Not on file   Highest education level: Not on file  Occupational History   Not on file  Tobacco Use   Smoking status: Never    Passive exposure: Never   Smokeless tobacco: Former  Building services engineer status: Never Used  Substance and Sexual Activity   Alcohol use: No   Drug use: No   Sexual activity: Not Currently  Other Topics Concern   Not on file  Social History Narrative    Not on file   Social Drivers of Health   Financial Resource Strain: Not on file  Food Insecurity: Not on file  Transportation Needs: Not on file  Physical Activity: Not on file  Stress: Not on file  Social Connections: Unknown (10/07/2021)   [facility] from [facility]   Social Network    Social Network: Not on file  Intimate Partner Violence: Unknown (09/08/2021)   [facility], [facility]   HITS    Physically Hurt: Not on file    Insult or Talk Down To: Not on file    Threaten Physical Harm: Not on file    Scream or Curse: Not on file    Review of Systems Constitutional: Patient denies any unintentional weight loss or change in strength lntegumentary: Patient denies any rashes or pruritus Cardiovascular: Patient denies chest pain or syncope Respiratory: Patient denies shortness of breath Gastrointestinal: Patient denies nausea, vomiting, constipation, or diarrhea Musculoskeletal: Patient denies muscle cramps  or weakness Neurologic: Patient denies convulsions or seizures Allergic/Immunologic: Patient denies recent allergic reaction(s) Hematologic/Lymphatic: Patient denies bleeding tendencies Endocrine: Patient denies heat/cold intolerance  GU: As per HPI.  OBJECTIVE Vitals:   07/20/23 1512 07/20/23 1548  BP: (!) 162/75 (!) 140/76  Pulse: 74 66   There is no height or weight on file to calculate BMI.  Physical Examination Constitutional: No obvious distress; patient is non-toxic appearing  Cardiovascular: No visible lower extremity edema.  Respiratory: The patient does not have audible wheezing/stridor; respirations do not appear labored  Gastrointestinal: Abdomen non-distended Musculoskeletal: Normal ROM of UEs  Skin: No obvious rashes/open sores  Neurologic: CN 2-12 grossly intact Psychiatric: Answered questions appropriately with normal affect  Hematologic/Lymphatic/Immunologic: No obvious bruises or sites of spontaneous  bleeding  Urine microscopy: 6-10 WBC/hpf, 0-2 RBC/hpf, many bacteria PVR: 19 ml  ASSESSMENT Recurrent UTI - Plan: Urinalysis, Routine w reflex microscopic, BLADDER SCAN AMB NON-IMAGING  Atrophic vaginitis - Plan: Urinalysis, Routine w reflex microscopic, BLADDER SCAN AMB NON-IMAGING  Lichen sclerosus - Plan: Urinalysis, Routine w reflex microscopic, BLADDER SCAN AMB NON-IMAGING  She is doing well. Asymptomatic today; no antibiotic indicated. Advised to continue topical vaginal estrogen cream as prescribed.   We agreed to plan for follow up in 1 year or sooner if needed. Patient verbalized understanding of and agreement with current plan. All questions were answered.  PLAN Advised the following: 1. Continue topical vaginal estrogen cream as prescribed.  2. Return in about 1 year (around 07/19/2024) for UA, PVR, & f/u with Evette Georges NP.  Orders Placed This Encounter  Procedures   Urinalysis, Routine w reflex microscopic   BLADDER SCAN AMB NON-IMAGING    It has been explained that the patient is to follow regularly with their PCP in addition to all other providers involved in their care and to follow instructions provided by these respective offices. Patient advised to contact urology clinic if any urologic-pertaining questions, concerns, new symptoms or problems arise in the interim period.  Patient Instructions  UTI prevention / management:  Difference between Urinalysis (urine dipstick test) and Urine culture:  Urinalysis (urine dipstick test): A quick office test used as an indicator to determine whether or not further testing is necessary (such as a urine culture, urine microscopy, etc.) The urinalysis cannot differentiate a true bacterial UTI or give a definitive diagnosis for the findings.  Urine culture: May be performed based on the findings of a urinalysis to evaluate for UTI. Grows out on a petri dish for 48-72 hours. Provides important information about: whether  or not bacterial growth is present and if so: what the predominant bacteria is which antibiotics will work best against that bacteria That information is important so that we can diagnose and treat patients appropriately as there are other conditions which may mimic UTls which must not be missed (such as cancer, interstitial cystitis, stones, etc.). Assists [place] with antibiotic stewardship to minimize patient's risk for developing antibiotic resistance (getting to a point where no antibiotics work anymore).  Options when UTI symptoms occur: 1. Call [facility] [place] and request to speak with triage nurse (phone # [phone], select option 3). In accordance with clinic guidelines the nurse will determine next steps based on patient-reported symptoms, which may include: same-day lab visit to provide urine specimen, recommendation to schedule Urology office visit appointment for further evaluation, recommendation to proceed to ER, etc. 2. Call your Primary Care Provider (PCP) office to request urgent / same-day visit. Be sure to request  for urine culture to be ordered and have results faxed to Urology (fax # [phone]).  3. Go to urgent care. Be sure to request for urine culture to be ordered and have results faxed to Urology (fax # [phone]).   For bladder pain/ burning with urination: - Can take over-the-counter Pyridium (phenazopyridine; commonly known under the "AZO" brand) for a few days as needed. Limit use to no more than 3 days consecutively due to risk for methemoglobinemia, liver function issues, and bone health damage with long term use of Pyridium. - Alternative: Prescription urinary analgesics (such as Uribel, Urogesic blue, Urelle, Uro-MP). Often expensive / poorly covered by insurance unfortunately.  Options / recommendations for UTI prevention: - Topical vaginal estrogen for vaginal atrophy (aka Genitourinary Syndrome of Menopause (GSM)). - Adequate daily fluid  intake to flush out the urinary tract. - Go to the bathroom to urinate every 4-6 hours while awake to minimize urinary stasis / bacterial overgrowth in the bladder. - Proanthocyanidin (PAC) supplement 36 mg daily; must be soluble (insoluble form of PAC will be ineffective). Recommended brand: Ellura. This is an over-the-counter supplement (often must be found/ purchased online) supplement derived from cranberries with concentrated active component: Proanthocyanidin (PAC) 36 mg daily. Decreases bacterial adherence to bladder lining.  - D-mannose powder (2 grams daily). This is an over-the-counter supplement which decreases bacterial adherence to bladder lining (it is a sugar that inhibits bacterial adherence to urothelial cells by binding to the pili of enteric bacteria). Take as per manufacturer recommendation. Can be used as an alternative or in addition to the concentrated cranberry supplement.  - Vitamin C supplement to acidify urine to minimize bacterial growth.  - Probiotic to maintain healthy vaginal microbiome to suppress bacteria at urethral opening. Brand recommendations: Darrold Junker (includes probiotic & D-mannose ), Feminine Balance (highest concentration of lactobacillus) or Hyperbiotic Pro 15.  Note for patients with diabetes:  - Be aware that D-mannose contains sugar.  Note for patients with interstitial cystitis (IC):  - Patients with IC should typically avoid cranberry/ PAC supplements and Vitamin C supplements due to their acidity, which may exacerbate IC-related bladder pain. - Symptoms of true bacterial UTI can overlap / mimic symptoms of an IC flare up. Antibiotic use is NOT indicated for IC flare ups. Urine culture needed prior to antibiotic treatment for IC patients. The goal is to minimize your risk for developing antibiotic-resistant bacteria.    Vaginal atrophy I Genitourinary syndrome of menopause (GSM):  What it is: Changes in the vaginal environment (including the vulva and  urethra) including: Thinning of the epithelium (skin/ mucosa surface) Can contribute to urinary urgency and frequency Can contribute to dryness, itching, irritation of the vulvar and vaginal tissue Can contribute to pain with intercourse Can contribute to physical changes of the labia, vulva, and vagina such as: Narrowing of the vaginal opening Decreased vaginal length Loss of labial architecture Labial adhesions Pale color of vulvovaginal tissue  Loss of pubic hair Allows bacteria to become adherent  Results in increased risk for urinary tract infection (UTI) due to bacterial overgrowth and migration up the urethra into the bladder Change in vaginal pH (acid/ base balance) Allows for alteration / disruption of the normal bacterial flora / microbiome Results in increased risk for urinary tract infection (UTI) due to bacterial overgrowth  Treatment options: Over-the-counter lubricants (see list below). Prescription vaginal estrogen replacement. Options: Topical vaginal estrogen cream Estrace, Premarin, or compounded estradiol cream/ gel We advise: Discard plastic applicator as that tends to use  more medication than you need, which is not harmful but wastes / uses up the medication. Also the plastic applicator may cause discomfort. Insert blueberry size amount of medication via the tip of your finger inside vagina nightly for 1 week then 2-3 times per week (long term). Estring vaginal ring Exchanged every 3 months (either at home or in office by provider) Vagifem vaginal tablet Inserted nightly for 2 weeks then twice a week (long term) lntrarosa vaginal suppository Vaginal DHEA: converts to estrogen in vaginal tissue without systemic effect Inserted nightly (long term) Vaginal laser therapy (Mona Lisa touch) Performed in 3 treatments each 6 weeks apart (available in our [place] office). Can feel like a sunburn for 3-4 days after each treatment until new skin heals in. Usually not  covered by insurance. Estimated cost is $1500 for all 3 sessions.  FYI regarding prescription vaginal estrogen treatment options: All topical vaginal estrogen replacement options are equivalent in terms of efficacy. Topical vaginal estrogen replacement will take about 3 months to be effective. OK to have sex with any of the topical vaginal estrogen replacement options. Topical vaginal estrogen replacement may sting/burn initially due to severe dryness, which will improve with ongoing treatment. There have been studies that evaluate use of low-dose intravaginal estrogen that show minimal systemic absorption which is negligible after 3 weeks. There have been no studies indicating increased risk of contributing to cancer development or recurrence.  Topical vaginal estrogen cream safe to use with breast cancer history [email or web address]  Topical vaginal estrogen cream safe to use with blood clot history [email or web address]   Lubricants and Moisturizers for Treating Genitourinary Syndrome of Menopause and Vulvovaginal Atrophy Treatment Comments I Available Products   Lubricants   Water-based Ingredients: Deionized water, glycerin, propylene glycol; latex safe; rare irritation; dry out with extended sexual activity Astroglide, Good Clean Love, K-Y Jelly, Natural, Organic, Pink, Sliquid, Sylk, Yes    Oil Based Ingredients: avocado, olive, peanut, corn; latex safe; can be used with silicone products; staining; safe (unless peanut allergy); non-irritating Coconut oil, vegetable oil, vitamin E oil  Silicone-Based Ingredients: Silicone polymers; staining; typically nonirritating, long lasting; waterproof; should not be used with silicone dilators, sexual toys,  or gynecologic products Astroglide X, Oceanus Ultra Pure, Pink Silicone, Pjur Eros, Replens Silky Smooth, Silicone Premium JO, SKYN, Uberlube, [employer] Based Minimize harm to sperm motility; designed Astroglide TTC, Conceive Plus, Pre for couples trying to conceive Seed, Yes Baby  Fertility Friendly Minimize harm to sperm motility; designed Astroglide, TTC, Conceive Plus, Pre for couples trying to conceive Seed, Yes Baby  Vaginal Moisturizers   Vaginal Moisturizers For maintenance use 1 to 3 times weekly; can benefit women with dryness, chafing with AOL, and recurrent vaginal infections irrespective of sexual activity timing Balance Active Menopause Vaginal Moisturizing Lubricant, Canesintima Intimate Moisturizer, Replens, Rephresh, Sylk Natural Intimate Moisturizer, Yes Vaginal Moisturizer  Hybrids Properties of both water and silicone-based products (combination of a vaginal lubricant and moisturizer); Non-irritating; good option for women with allergies and sensitivities Lubrigyn, Luvena  Suppositories Hyaluronic acid to retain moisture Revaree  Vulvar Soothing Creams/Oils    Medicated CreamsP ain and burn relief; Ingredients: 4% Lidocaine, Aloe Vera gel Releveum ([place])  Non-Medicated Creams For anti-itch and moisture/maintenance; Ingredients: Coconut oil, Avocado oil, Shea Butter, Olive oil, Vitamin E Vajuvenate, Vmagic  Oils !For moisture/maintenance !Coconut oil, Vitamin E oil, Emu oil     Electronically signed by:  Donnita Falls, FNP   07/20/23    4:37 PM

## 2023-07-20 NOTE — Patient Instructions (Signed)
 UTI prevention / management:  Difference between Urinalysis (urine dipstick test) and Urine culture:  Urinalysis (urine dipstick test): A quick office test used as an indicator to determine whether or not further testing is necessary (such as a urine culture, urine microscopy, etc.) The urinalysis cannot differentiate a true bacterial UTI or give a definitive diagnosis for the findings.  Urine culture: May be performed based on the findings of a urinalysis to evaluate for UTI. Grows out on a petri dish for 48-72 hours. Provides important information about: whether or not bacterial growth is present and if so: what the predominant bacteria is which antibiotics will work best against that bacteria That information is important so that we can diagnose and treat patients appropriately as there are other conditions which may mimic UTls which must not be missed (such as cancer, interstitial cystitis, stones, etc.). Assists [place] with antibiotic stewardship to minimize patient's risk for developing antibiotic resistance (getting to a point where no antibiotics work anymore).  Options when UTI symptoms occur: 1. Call [facility] [place] and request to speak with triage nurse (phone # [phone], select option 3). In accordance with clinic guidelines the nurse will determine next steps based on patient-reported symptoms, which may include: same-day lab visit to provide urine specimen, recommendation to schedule Urology office visit appointment for further evaluation, recommendation to proceed to ER, etc. 2. Call your Primary Care Provider (PCP) office to request urgent / same-day visit. Be sure to request for urine culture to be ordered and have results faxed to Urology (fax # [phone]).  3. Go to urgent care. Be sure to request for urine culture to be ordered and have results faxed to Urology (fax # [phone]).   For bladder pain/ burning with urination: - Can take over-the-counter  Pyridium (phenazopyridine; commonly known under the "AZO" brand) for a few days as needed. Limit use to no more than 3 days consecutively due to risk for methemoglobinemia, liver function issues, and bone health damage with long term use of Pyridium. - Alternative: Prescription urinary analgesics (such as Uribel, Urogesic blue, Urelle, Uro-MP). Often expensive / poorly covered by insurance unfortunately.  Options / recommendations for UTI prevention: - Topical vaginal estrogen for vaginal atrophy (aka Genitourinary Syndrome of Menopause (GSM)). - Adequate daily fluid intake to flush out the urinary tract. - Go to the bathroom to urinate every 4-6 hours while awake to minimize urinary stasis / bacterial overgrowth in the bladder. - Proanthocyanidin (PAC) supplement 36 mg daily; must be soluble (insoluble form of PAC will be ineffective). Recommended brand: Ellura. This is an over-the-counter supplement (often must be found/ purchased online) supplement derived from cranberries with concentrated active component: Proanthocyanidin (PAC) 36 mg daily. Decreases bacterial adherence to bladder lining.  - D-mannose powder (2 grams daily). This is an over-the-counter supplement which decreases bacterial adherence to bladder lining (it is a sugar that inhibits bacterial adherence to urothelial cells by binding to the pili of enteric bacteria). Take as per manufacturer recommendation. Can be used as an alternative or in addition to the concentrated cranberry supplement.  - Vitamin C supplement to acidify urine to minimize bacterial growth.  - Probiotic to maintain healthy vaginal microbiome to suppress bacteria at urethral opening. Brand recommendations: Darrold Junker (includes probiotic & D-mannose ), Feminine Balance (highest concentration of lactobacillus) or Hyperbiotic Pro 15.  Note for patients with diabetes:  - Be aware that D-mannose contains sugar.  Note for patients with interstitial cystitis (IC):  -  Patients with IC should  typically avoid cranberry/ PAC supplements and Vitamin C supplements due to their acidity, which may exacerbate IC-related bladder pain. - Symptoms of true bacterial UTI can overlap / mimic symptoms of an IC flare up. Antibiotic use is NOT indicated for IC flare ups. Urine culture needed prior to antibiotic treatment for IC patients. The goal is to minimize your risk for developing antibiotic-resistant bacteria.    Vaginal atrophy I Genitourinary syndrome of menopause (GSM):  What it is: Changes in the vaginal environment (including the vulva and urethra) including: Thinning of the epithelium (skin/ mucosa surface) Can contribute to urinary urgency and frequency Can contribute to dryness, itching, irritation of the vulvar and vaginal tissue Can contribute to pain with intercourse Can contribute to physical changes of the labia, vulva, and vagina such as: Narrowing of the vaginal opening Decreased vaginal length Loss of labial architecture Labial adhesions Pale color of vulvovaginal tissue Loss of pubic hair Allows bacteria to become adherent  Results in increased risk for urinary tract infection (UTI) due to bacterial overgrowth and migration up the urethra into the bladder Change in vaginal pH (acid/ base balance) Allows for alteration / disruption of the normal bacterial flora / microbiome Results in increased risk for urinary tract infection (UTI) due to bacterial overgrowth  Treatment options: Over-the-counter lubricants (see list below). Prescription vaginal estrogen replacement. Options: Topical vaginal estrogen cream Estrace, Premarin, or compounded estradiol cream/ gel We advise: Discard plastic applicator as that tends to use more medication than you need, which is not harmful but wastes / uses up the medication. Also the plastic applicator may cause discomfort. Insert blueberry size amount of medication via the tip of your finger inside vagina  nightly for 1 week then 2-3 times per week (long term). Estring vaginal ring Exchanged every 3 months (either at home or in office by provider) Vagifem vaginal tablet Inserted nightly for 2 weeks then twice a week (long term) lntrarosa vaginal suppository Vaginal DHEA: converts to estrogen in vaginal tissue without systemic effect Inserted nightly (long term) Vaginal laser therapy (Mona Lisa touch) Performed in 3 treatments each 6 weeks apart (available in our [place] office). Can feel like a sunburn for 3-4 days after each treatment until new skin heals in. Usually not covered by insurance. Estimated cost is $1500 for all 3 sessions.  FYI regarding prescription vaginal estrogen treatment options: All topical vaginal estrogen replacement options are equivalent in terms of efficacy. Topical vaginal estrogen replacement will take about 3 months to be effective. OK to have sex with any of the topical vaginal estrogen replacement options. Topical vaginal estrogen replacement may sting/burn initially due to severe dryness, which will improve with ongoing treatment. There have been studies that evaluate use of low-dose intravaginal estrogen that show minimal systemic absorption which is negligible after 3 weeks. There have been no studies indicating increased risk of contributing to cancer development or recurrence.  Topical vaginal estrogen cream safe to use with breast cancer history [email or web address]  Topical vaginal estrogen cream safe to use with blood clot history [email or web address]   Lubricants and Moisturizers for Treating Genitourinary Syndrome of Menopause and Vulvovaginal Atrophy Treatment Comments I Available Products   Lubricants    Water-based Ingredients: Deionized water, glycerin, propylene glycol; latex safe; rare irritation; dry out with extended sexual activity Astroglide, Good Clean Love, K-Y Jelly, Natural, Organic, Pink, Sliquid, Sylk, Yes    Oil Based Ingredients: avocado, olive, peanut, corn; latex safe; can be used with silicone products; staining; safe (unless peanut allergy); non-irritating Coconut  oil, vegetable oil, vitamin E oil  Silicone-Based Ingredients: Silicone polymers; staining; typically nonirritating, long lasting; waterproof; should not be used with silicone dilators, sexual toys, or gynecologic products Astroglide X, Oceanus Ultra Pure, Pink Silicone, Pjur Eros, Replens Silky Smooth, Silicone Premium JO, SKYN, Uberlube, [employer] Based Minimize harm to sperm motility; designed Astroglide TTC, Conceive Plus, Pre for couples trying to conceive Seed, Yes Baby  Fertility Friendly Minimize harm to sperm motility; designed Astroglide, TTC, Conceive Plus, Pre for couples trying to conceive Seed, Yes Baby  Vaginal Moisturizers   Vaginal Moisturizers For maintenance use 1 to 3 times weekly; can benefit women with dryness, chafing with AOL, and recurrent vaginal infections irrespective of sexual activity timing Balance Active Menopause Vaginal Moisturizing Lubricant, Canesintima Intimate Moisturizer, Replens, Rephresh, Sylk Natural Intimate Moisturizer, Yes Vaginal Moisturizer  Hybrids Properties of both water and silicone-based products (combination of a vaginal lubricant and moisturizer); Non-irritating; good option for women with allergies and sensitivities Lubrigyn, Luvena  Suppositories Hyaluronic acid to retain moisture Revaree  Vulvar Soothing Creams/Oils    Medicated CreamsP ain and burn relief; Ingredients: 4% Lidocaine, Aloe Vera gel Releveum (Desert Wheaton)  Non-Medicated Creams For anti-itch and moisture/maintenance; Ingredients: Coconut oil, Avocado oil, Shea Butter,  Olive oil, Vitamin E Vajuvenate, Vmagic  Oils !For moisture/maintenance !Coconut oil, Vitamin E oil, Emu oil

## 2023-07-21 LAB — MICROSCOPIC EXAMINATION

## 2023-07-21 LAB — URINALYSIS, ROUTINE W REFLEX MICROSCOPIC
Bilirubin, UA: NEGATIVE
Glucose, UA: NEGATIVE
Ketones, UA: NEGATIVE
Nitrite, UA: POSITIVE — AB
Protein,UA: NEGATIVE
RBC, UA: NEGATIVE
Specific Gravity, UA: 1.015 (ref 1.005–1.030)
Urobilinogen, Ur: 1 mg/dL (ref 0.2–1.0)
pH, UA: 6.5 (ref 5.0–7.5)

## 2023-09-19 ENCOUNTER — Other Ambulatory Visit (HOSPITAL_COMMUNITY): Payer: Self-pay | Admitting: Nurse Practitioner

## 2023-09-19 DIAGNOSIS — Z13820 Encounter for screening for osteoporosis: Secondary | ICD-10-CM

## 2023-11-19 ENCOUNTER — Other Ambulatory Visit (INDEPENDENT_AMBULATORY_CARE_PROVIDER_SITE_OTHER): Payer: Self-pay | Admitting: Gastroenterology

## 2023-11-19 DIAGNOSIS — K219 Gastro-esophageal reflux disease without esophagitis: Secondary | ICD-10-CM

## 2023-11-19 DIAGNOSIS — K2970 Gastritis, unspecified, without bleeding: Secondary | ICD-10-CM

## 2023-11-20 ENCOUNTER — Other Ambulatory Visit (INDEPENDENT_AMBULATORY_CARE_PROVIDER_SITE_OTHER): Payer: Self-pay

## 2023-11-24 ENCOUNTER — Ambulatory Visit (HOSPITAL_COMMUNITY)
Admission: RE | Admit: 2023-11-24 | Discharge: 2023-11-24 | Disposition: A | Discharge status: Home / Self Care | Source: Ambulatory Visit | Attending: Nurse Practitioner | Admitting: Nurse Practitioner

## 2023-11-24 DIAGNOSIS — Z780 Asymptomatic menopausal state: Secondary | ICD-10-CM | POA: Diagnosis not present

## 2023-11-24 DIAGNOSIS — M8589 Other specified disorders of bone density and structure, multiple sites: Secondary | ICD-10-CM | POA: Diagnosis not present

## 2023-11-24 DIAGNOSIS — Z13820 Encounter for screening for osteoporosis: Secondary | ICD-10-CM | POA: Diagnosis present

## 2024-04-10 ENCOUNTER — Encounter (INDEPENDENT_AMBULATORY_CARE_PROVIDER_SITE_OTHER): Payer: Self-pay | Admitting: Gastroenterology

## 2024-07-25 ENCOUNTER — Ambulatory Visit: Payer: Medicare HMO | Admitting: Urology
